# Patient Record
Sex: Female | Born: 1965 | Race: Black or African American | Hispanic: No | Marital: Single | Smoking: Never smoker
Health system: Southern US, Community
[De-identification: ages and names within clinical notes are randomized; demographics above are authoritative.]

## PROBLEM LIST (undated history)

## (undated) DIAGNOSIS — I10 Essential (primary) hypertension: Secondary | ICD-10-CM

## (undated) DIAGNOSIS — E119 Type 2 diabetes mellitus without complications: Secondary | ICD-10-CM

## (undated) DIAGNOSIS — E785 Hyperlipidemia, unspecified: Secondary | ICD-10-CM

## (undated) HISTORY — DX: Hyperlipidemia, unspecified: E78.5

## (undated) HISTORY — PX: TUBAL LIGATION: SHX77

---

## 1999-01-28 ENCOUNTER — Other Ambulatory Visit: Admission: RE | Admit: 1999-01-28 | Discharge: 1999-01-28 | Payer: Self-pay | Admitting: Obstetrics and Gynecology

## 1999-05-13 ENCOUNTER — Emergency Department (HOSPITAL_COMMUNITY): Admission: EM | Admit: 1999-05-13 | Discharge: 1999-05-13 | Payer: Self-pay | Admitting: Emergency Medicine

## 1999-08-06 ENCOUNTER — Inpatient Hospital Stay (HOSPITAL_COMMUNITY): Admission: AD | Admit: 1999-08-06 | Discharge: 1999-08-10 | Payer: Self-pay | Admitting: Obstetrics and Gynecology

## 1999-08-06 ENCOUNTER — Encounter (INDEPENDENT_AMBULATORY_CARE_PROVIDER_SITE_OTHER): Payer: Self-pay | Admitting: Specialist

## 1999-09-08 ENCOUNTER — Other Ambulatory Visit: Admission: RE | Admit: 1999-09-08 | Discharge: 1999-09-08 | Payer: Self-pay | Admitting: Obstetrics and Gynecology

## 2000-01-10 ENCOUNTER — Ambulatory Visit (HOSPITAL_COMMUNITY): Admission: RE | Admit: 2000-01-10 | Discharge: 2000-01-10 | Payer: Self-pay | Admitting: *Deleted

## 2001-04-02 ENCOUNTER — Other Ambulatory Visit: Admission: RE | Admit: 2001-04-02 | Discharge: 2001-04-02 | Payer: Self-pay | Admitting: Internal Medicine

## 2003-02-04 ENCOUNTER — Encounter: Admission: RE | Admit: 2003-02-04 | Discharge: 2003-02-04 | Payer: Self-pay | Admitting: Family Medicine

## 2003-08-05 ENCOUNTER — Emergency Department (HOSPITAL_COMMUNITY): Admission: AD | Admit: 2003-08-05 | Discharge: 2003-08-05 | Payer: Self-pay | Admitting: Family Medicine

## 2004-11-17 ENCOUNTER — Other Ambulatory Visit: Admission: RE | Admit: 2004-11-17 | Discharge: 2004-11-17 | Payer: Self-pay | Admitting: Family Medicine

## 2005-04-07 ENCOUNTER — Emergency Department (HOSPITAL_COMMUNITY): Admission: EM | Admit: 2005-04-07 | Discharge: 2005-04-07 | Payer: Self-pay | Admitting: Family Medicine

## 2006-01-04 ENCOUNTER — Other Ambulatory Visit: Admission: RE | Admit: 2006-01-04 | Discharge: 2006-01-04 | Payer: Self-pay | Admitting: Family Medicine

## 2007-01-04 ENCOUNTER — Encounter: Admission: RE | Admit: 2007-01-04 | Discharge: 2007-01-04 | Payer: Self-pay | Admitting: Physician Assistant

## 2007-01-24 ENCOUNTER — Other Ambulatory Visit: Admission: RE | Admit: 2007-01-24 | Discharge: 2007-01-24 | Payer: Self-pay | Admitting: Family Medicine

## 2008-02-01 ENCOUNTER — Other Ambulatory Visit: Admission: RE | Admit: 2008-02-01 | Discharge: 2008-02-01 | Payer: Self-pay | Admitting: Family Medicine

## 2008-02-01 ENCOUNTER — Encounter: Admission: RE | Admit: 2008-02-01 | Discharge: 2008-02-01 | Payer: Self-pay | Admitting: Family Medicine

## 2009-03-03 ENCOUNTER — Encounter: Admission: RE | Admit: 2009-03-03 | Discharge: 2009-03-03 | Payer: Self-pay | Admitting: Family Medicine

## 2009-03-17 ENCOUNTER — Other Ambulatory Visit: Admission: RE | Admit: 2009-03-17 | Discharge: 2009-03-17 | Payer: Self-pay | Admitting: Family Medicine

## 2010-03-04 ENCOUNTER — Encounter: Admission: RE | Admit: 2010-03-04 | Discharge: 2010-03-04 | Payer: Self-pay | Admitting: Family Medicine

## 2010-03-22 ENCOUNTER — Other Ambulatory Visit: Admission: RE | Admit: 2010-03-22 | Discharge: 2010-03-22 | Payer: Self-pay | Admitting: Family Medicine

## 2010-12-10 NOTE — Op Note (Signed)
[facility]  Patient:    April Daniel, April Daniel                     MRN: [id number] Proc. Date: 01/10/00 Adm. Date:  [id number] Disc. Date: [id number] Attending:  Pleas Koch                           Operative Report  PREOPERATIVE DIAGNOSIS:       Elective sterilization, desired.  POSTOPERATIVE DIAGNOSIS:      Elective sterilization, desired.  OPERATION:                    Laparoscopic tubal cautery.  SURGEON:                      Georgina Peer, M.D.  ASSISTANT:  ANESTHESIA:                   General anesthesia.  ESTIMATED BLOOD LOSS:         Less than 25 cc.  COMPLICATIONS:                None.  FINDINGS:                     Normal tubes and ovaries.  INDICATIONS:                  A 45 year old, gravida 3, para 2, desiring permanent sterilization.  She understands risks and complications of laparoscopy including bowel, bladder, and vascular injury, bleeding, infection, and lifetime failure ate of 1 to 2%.  She accepted these and wished to proceed.  DESCRIPTION OF PROCEDURE:     She was taken to the operating room and given a general anesthesia and placed in the dorsal lithotomy position.  The abdomen, perineum, and vagina were prepped and draped sterilely.  The bladder was emptied with a catheter.  Bimanual examination revealed anterior normal size uterus with no adnexal masses.  A vertical subumbilical incision was made and a Veress needle placed under low pressures insufflating 2.5 liters of carbon dioxide gas.  The laparoscopic trocar and sleeve were placed and under direct vision, the tubes were identified, traced to their fimbriated ends, cauterized with bipolar cautery in the midportion until bubbling, blanching, and no current passed through the tubes. A 2 cm segment on each side were cauterized.  Both ovaries looked normal.  There was a slight retraction pocket just lateral to the ureter on the right, but  no evidence of gross endometriosis or adhesions.  The upper abdomen was without adhesions and the uterus appeared normal.  Photodocumentation of these findings was made. The laparoscope was removed after 5 cc of 0.25% Marcaine was drizzled on the cauterized tubes.  The air was allowed to escape.  5 cc of 0.25% Marcaine was injected into the skin incision and then the skin incision was closed with subcuticular Dexon. The vaginal instrument was removed.  Sponge, needle, and instrument counts were  correct.  The patient returned to the recovery room in good condition. DD:  01/10/00 TD:  01/12/00 Job: [id number]

## 2010-12-10 NOTE — Discharge Summary (Signed)
[facility]  Patient:    April Daniel                      MRN: [id number] Adm. Date:  [id number] Disc. Date: [id number] Attending:  Ardeen Fillers                           Discharge Summary  DISCHARGE DIAGNOSES:          1. Intrauterine pregnancy at 37+ weeks gestational                                  age.                               2. Rh positive.                               3. Pregnancy-induced hypertension.                               4. Obesity.                               5. Postpartum hemorrhage secondary to uterine atony.                               6. Postpartum anemia.  PROCEDURE:                    Spontaneous vaginal delivery, repair of midline episiotomy on August 07, 1999.  Pitocin induction on January 12 to August 07, 1999.  HISTORY OF PRESENT ILLNESS:   A 45 year old woman, gravida 3, para 0-0-2-0, estimated date of confinement August 26, 1999, on the basis of first trimester  ultrasound admitted for worsening hypertension.  The patient had a slight headache. There have been no reports of intrauterine growth restriction or oligohydramnios. Blood pressure on examination in the office on January 12, was 170/108.  The patient has maintained complete bed rest and has received Aldamet during her pregnancy.  Labetolol was added recently.  Recent proteinuria evaluation was 665 mg for 24 hours.  HOSPITAL COURSE:              The patient was admitted to [facility].  She was treated with magnesium IV.  Cervix on admission was 3 cm.  Pitocin was initiated.  Amniotomy was performed for clear fluid.  Aldamet and labetolol were continued intrapartum.  The patient received IV analgesia in the  early part of labor and then received her epidural at her request.  Labor progressed steadily with a second stage of one hour and one minute.  She was delivered spontaneously of a live female, 2950 grams  with Apgars of 8 and 9 over a midline episiotomy.  Nuchal cord was reduced at the time of delivery.  Uterine atony occurred with an estimated blood loss of approximately 800 cc which responded to bimanual massage of the uterus and IV Pitocin.  Episiotomy was repaired without difficulty.  Arterial cord pH was 7.23.  Placenta was sent to pathology and later revealed  a third trimester placenta with three vessel umbilical cord.  Magnesium was continued for the first 24 hours.  Blood pressure stabilized nicely with diastolics in the 50 to 60 range.  Hemoglobin stabilized at 9.7.  This was  well tolerated and treated conservatively.  On the second postpartum day, diastolic blood pressures were intermittently elevated. She received IV labetolol and blood pressures stabilized with diastolics in the 80 to 90 range thereafter.  She is discharged to home on the third postpartum day in stable condition.  DISCHARGE MEDICATIONS:        1. Labetolol 200 mg p.o. b.i.d.                               2. Prenatal vitamins and iron.                               3. Nonsteroidal anti-inflammatory agents.  FOLLOW-UP:                    She will return to the office in one week for repeat blood pressure check.  She will be followed up in the office for routine postpartum care in four to six weeks.DD:  09/15/99 TD:  09/15/99 Job: [id number]

## 2011-01-27 ENCOUNTER — Other Ambulatory Visit: Payer: Self-pay | Admitting: Family Medicine

## 2011-01-27 DIAGNOSIS — Z1231 Encounter for screening mammogram for malignant neoplasm of breast: Secondary | ICD-10-CM

## 2011-04-01 ENCOUNTER — Other Ambulatory Visit: Payer: Self-pay | Admitting: Physician Assistant

## 2011-04-01 ENCOUNTER — Other Ambulatory Visit (HOSPITAL_COMMUNITY)
Admission: RE | Admit: 2011-04-01 | Discharge: 2011-04-01 | Disposition: A | Discharge status: Home / Self Care | Payer: PRIVATE HEALTH INSURANCE | Source: Ambulatory Visit | Attending: Family Medicine | Admitting: Family Medicine

## 2011-04-01 ENCOUNTER — Ambulatory Visit
Admission: RE | Admit: 2011-04-01 | Discharge: 2011-04-01 | Disposition: A | Discharge status: Home / Self Care | Payer: PRIVATE HEALTH INSURANCE | Source: Ambulatory Visit | Attending: Family Medicine | Admitting: Family Medicine

## 2011-04-01 DIAGNOSIS — Z01419 Encounter for gynecological examination (general) (routine) without abnormal findings: Secondary | ICD-10-CM | POA: Insufficient documentation

## 2011-04-01 DIAGNOSIS — Z1231 Encounter for screening mammogram for malignant neoplasm of breast: Secondary | ICD-10-CM

## 2012-02-06 ENCOUNTER — Other Ambulatory Visit: Payer: Self-pay | Admitting: Family Medicine

## 2012-02-06 DIAGNOSIS — Z1231 Encounter for screening mammogram for malignant neoplasm of breast: Secondary | ICD-10-CM

## 2012-04-02 ENCOUNTER — Other Ambulatory Visit: Payer: Self-pay | Admitting: Physician Assistant

## 2012-04-02 ENCOUNTER — Ambulatory Visit
Admission: RE | Admit: 2012-04-02 | Discharge: 2012-04-02 | Disposition: A | Discharge status: Home / Self Care | Payer: BC Managed Care – PPO | Source: Ambulatory Visit | Attending: Family Medicine | Admitting: Family Medicine

## 2012-04-02 ENCOUNTER — Other Ambulatory Visit (HOSPITAL_COMMUNITY)
Admission: RE | Admit: 2012-04-02 | Discharge: 2012-04-02 | Disposition: A | Discharge status: Home / Self Care | Payer: BC Managed Care – PPO | Source: Ambulatory Visit | Attending: Family Medicine | Admitting: Family Medicine

## 2012-04-02 DIAGNOSIS — Z1231 Encounter for screening mammogram for malignant neoplasm of breast: Secondary | ICD-10-CM

## 2012-04-02 DIAGNOSIS — Z124 Encounter for screening for malignant neoplasm of cervix: Secondary | ICD-10-CM | POA: Insufficient documentation

## 2013-03-04 ENCOUNTER — Other Ambulatory Visit: Payer: Self-pay

## 2013-03-04 DIAGNOSIS — Z1231 Encounter for screening mammogram for malignant neoplasm of breast: Secondary | ICD-10-CM

## 2013-03-08 ENCOUNTER — Other Ambulatory Visit: Payer: Self-pay | Admitting: Physician Assistant

## 2013-03-08 DIAGNOSIS — N6452 Nipple discharge: Secondary | ICD-10-CM

## 2013-04-03 ENCOUNTER — Ambulatory Visit
Admission: RE | Admit: 2013-04-03 | Discharge: 2013-04-03 | Disposition: A | Discharge status: Home / Self Care | Payer: PRIVATE HEALTH INSURANCE | Source: Ambulatory Visit | Attending: Physician Assistant | Admitting: Physician Assistant

## 2013-04-03 DIAGNOSIS — N6452 Nipple discharge: Secondary | ICD-10-CM

## 2013-04-26 ENCOUNTER — Other Ambulatory Visit: Payer: Self-pay | Admitting: Physician Assistant

## 2013-04-26 ENCOUNTER — Other Ambulatory Visit (HOSPITAL_COMMUNITY)
Admission: RE | Admit: 2013-04-26 | Discharge: 2013-04-26 | Disposition: A | Discharge status: Home / Self Care | Payer: PRIVATE HEALTH INSURANCE | Source: Ambulatory Visit | Attending: Family Medicine | Admitting: Family Medicine

## 2013-04-26 DIAGNOSIS — Z124 Encounter for screening for malignant neoplasm of cervix: Secondary | ICD-10-CM | POA: Insufficient documentation

## 2014-03-05 ENCOUNTER — Other Ambulatory Visit: Payer: Self-pay

## 2014-03-05 DIAGNOSIS — Z1231 Encounter for screening mammogram for malignant neoplasm of breast: Secondary | ICD-10-CM

## 2014-04-07 ENCOUNTER — Ambulatory Visit
Admission: RE | Admit: 2014-04-07 | Discharge: 2014-04-07 | Disposition: A | Discharge status: Home / Self Care | Payer: 59 | Source: Ambulatory Visit

## 2014-04-07 DIAGNOSIS — Z1231 Encounter for screening mammogram for malignant neoplasm of breast: Secondary | ICD-10-CM

## 2014-05-16 ENCOUNTER — Other Ambulatory Visit (HOSPITAL_COMMUNITY)
Admission: RE | Admit: 2014-05-16 | Discharge: 2014-05-16 | Disposition: A | Discharge status: Home / Self Care | Payer: 59 | Source: Ambulatory Visit | Attending: Family Medicine | Admitting: Family Medicine

## 2014-05-16 ENCOUNTER — Other Ambulatory Visit: Payer: Self-pay | Admitting: Physician Assistant

## 2014-05-16 DIAGNOSIS — Z124 Encounter for screening for malignant neoplasm of cervix: Secondary | ICD-10-CM | POA: Insufficient documentation

## 2014-05-19 LAB — CYTOLOGY - PAP

## 2015-05-11 ENCOUNTER — Other Ambulatory Visit: Payer: Self-pay

## 2015-05-11 DIAGNOSIS — Z1231 Encounter for screening mammogram for malignant neoplasm of breast: Secondary | ICD-10-CM

## 2015-05-27 ENCOUNTER — Ambulatory Visit
Admission: RE | Admit: 2015-05-27 | Discharge: 2015-05-27 | Disposition: A | Discharge status: Home / Self Care | Payer: 59 | Source: Ambulatory Visit

## 2015-05-27 DIAGNOSIS — Z1231 Encounter for screening mammogram for malignant neoplasm of breast: Secondary | ICD-10-CM

## 2015-05-29 ENCOUNTER — Other Ambulatory Visit (HOSPITAL_COMMUNITY)
Admission: RE | Admit: 2015-05-29 | Discharge: 2015-05-29 | Disposition: A | Discharge status: Home / Self Care | Payer: 59 | Source: Ambulatory Visit | Attending: Family Medicine | Admitting: Family Medicine

## 2015-05-29 ENCOUNTER — Other Ambulatory Visit: Payer: Self-pay | Admitting: Physician Assistant

## 2015-05-29 DIAGNOSIS — Z124 Encounter for screening for malignant neoplasm of cervix: Secondary | ICD-10-CM | POA: Diagnosis not present

## 2015-06-01 LAB — CYTOLOGY - PAP

## 2016-05-03 ENCOUNTER — Other Ambulatory Visit: Payer: Self-pay | Admitting: Physician Assistant

## 2016-05-03 DIAGNOSIS — Z1231 Encounter for screening mammogram for malignant neoplasm of breast: Secondary | ICD-10-CM

## 2016-05-30 ENCOUNTER — Ambulatory Visit
Admission: RE | Admit: 2016-05-30 | Discharge: 2016-05-30 | Disposition: A | Discharge status: Home / Self Care | Payer: 59 | Source: Ambulatory Visit | Attending: Physician Assistant | Admitting: Physician Assistant

## 2016-05-30 DIAGNOSIS — Z1231 Encounter for screening mammogram for malignant neoplasm of breast: Secondary | ICD-10-CM

## 2017-05-03 ENCOUNTER — Other Ambulatory Visit: Payer: Self-pay | Admitting: Physician Assistant

## 2017-05-03 DIAGNOSIS — Z1231 Encounter for screening mammogram for malignant neoplasm of breast: Secondary | ICD-10-CM

## 2017-05-19 ENCOUNTER — Emergency Department (HOSPITAL_COMMUNITY): Payer: Worker's Compensation

## 2017-05-19 ENCOUNTER — Encounter (HOSPITAL_COMMUNITY): Payer: Self-pay | Admitting: *Deleted

## 2017-05-19 ENCOUNTER — Emergency Department (HOSPITAL_COMMUNITY)
Admission: EM | Admit: 2017-05-19 | Discharge: 2017-05-19 | Disposition: A | Discharge status: Home / Self Care | Payer: Worker's Compensation | Attending: Emergency Medicine | Admitting: Emergency Medicine

## 2017-05-19 DIAGNOSIS — I10 Essential (primary) hypertension: Secondary | ICD-10-CM | POA: Diagnosis not present

## 2017-05-19 DIAGNOSIS — Y93D1 Activity, knitting and crocheting: Secondary | ICD-10-CM | POA: Diagnosis not present

## 2017-05-19 DIAGNOSIS — S6992XA Unspecified injury of left wrist, hand and finger(s), initial encounter: Secondary | ICD-10-CM | POA: Diagnosis present

## 2017-05-19 DIAGNOSIS — S60451A Superficial foreign body of left index finger, initial encounter: Secondary | ICD-10-CM | POA: Diagnosis not present

## 2017-05-19 DIAGNOSIS — Y999 Unspecified external cause status: Secondary | ICD-10-CM | POA: Insufficient documentation

## 2017-05-19 DIAGNOSIS — W458XXA Other foreign body or object entering through skin, initial encounter: Secondary | ICD-10-CM | POA: Diagnosis not present

## 2017-05-19 DIAGNOSIS — Z23 Encounter for immunization: Secondary | ICD-10-CM | POA: Diagnosis not present

## 2017-05-19 DIAGNOSIS — S61221A Laceration with foreign body of left index finger without damage to nail, initial encounter: Secondary | ICD-10-CM | POA: Diagnosis not present

## 2017-05-19 DIAGNOSIS — Y929 Unspecified place or not applicable: Secondary | ICD-10-CM | POA: Insufficient documentation

## 2017-05-19 HISTORY — DX: Essential (primary) hypertension: I10

## 2017-05-19 MED ORDER — TETANUS-DIPHTH-ACELL PERTUSSIS 5-2.5-18.5 LF-MCG/0.5 IM SUSP
0.5000 mL | Freq: Once | INTRAMUSCULAR | Status: AC
  Administered 2017-05-19: 0.5 mL via INTRAMUSCULAR
  Filled 2017-05-19: qty 0.5

## 2017-05-19 NOTE — Discharge Instructions (Signed)
You may remove the bandage after 24 hours. Clean the wound and surrounding area gently with tap water and mild soap. Rinse well and blot dry. Do not scrub the wound, as this may cause the wound edges to come apart. You may shower, but avoid submerging the wound, such as with a bath or swimming. Clean the wound daily to prevent infection. Do not use cleaners such as hydrogen peroxide or alcohol.   Scar reduction: Application of a topical antibiotic ointment, such as Neosporin, after the wound has begun to close and heal well can decrease scab formation and reduce scarring. After the wound has healed and wound closures have been removed, application of ointments such as Aquaphor can also reduce scar formation.  The key to scar reduction is keeping the skin well hydrated and supple. Drinking plenty of water throughout the day (At least eight 8oz glasses of water a day) is essential to staying well hydrated.  Sun exposure: Keep the wound out of the sun. After the wound has healed, continue to protect it from the sun by wearing protective clothing or applying sunscreen.  Pain: You may use Tylenol, naproxen, or ibuprofen for pain.  Antiinflammatory medications: Take 600 mg of ibuprofen every 6 hours or 440 mg (over the counter dose) to 500 mg (prescription dose) of naproxen every 12 hours for the next 3 days. After this time, these medications may be used as needed for pain. Take these medications with food to avoid upset stomach. Choose only one of these medications, do not take them together. Tylenol: Should you continue to have additional pain while taking the ibuprofen or naproxen, you may add in tylenol as needed. Your daily total maximum amount of tylenol from all sources should be limited to 4000mg /day for persons without liver problems, or 2000mg /day for those with liver problems.  Suture/staple removal: Return to the ED in 8-10 days for suture removal.  Return to the ED sooner should the wound  edges come apart or signs of infection arise, such as spreading redness, puffiness/swelling, pus draining from the wound, severe increase in pain, fever over 100.74F, or any other major issues.

## 2017-05-19 NOTE — ED Provider Notes (Signed)
[facility]-EMERGENCY DEPT Provider Note   CSN: [id number] Arrival date & time: 05/19/17  0955     History   Chief Complaint Chief Complaint  Patient presents with  . knitting hook stuck in finger    HPI April Daniel is a 51 y.o. female.  HPI   April Daniel is a 51 y.o. female, with a history of HTN, presenting to the ED with a foreign body in the left index finger.  Patient was knitting, slipped, and the needle became embedded in the skin of her finger.  Pain is sharp, 2/10, nonradiating.  Has not tried any therapies prior to arrival.  Tetanus not up-to-date.  Patient denies numbness, tingling, weakness, or other complaints.     Past Medical History:  Diagnosis Date  . Hypertension     There are no active problems to display for this patient.   History reviewed. No pertinent surgical history.  OB History    No data available       Home Medications    Prior to Admission medications   Not on File    Family History No family history on file.  Social History Social History  Substance Use Topics  . Smoking status: Never Smoker  . Smokeless tobacco: Never Used  . Alcohol use No     Allergies   Patient has no known allergies.   Review of Systems Review of Systems  Skin: Positive for wound.       Foreign body  Neurological: Negative for weakness and numbness.     Physical Exam Updated Vital Signs BP (!) 138/105   Pulse 71   Temp 97.8 F (36.6 C)   Resp 18   SpO2 97%   Physical Exam  Constitutional: She appears well-developed and well-nourished. No distress.  HENT:  Head: Normocephalic and atraumatic.  Eyes: Conjunctivae are normal.  Neck: Neck supple.  Cardiovascular: Normal rate, regular rhythm and intact distal pulses.   Pulmonary/Chest: Effort normal.  Musculoskeletal:  Full range of motion at the MCP, PIP, and DIP joints of the left index finger.  Neurological: She is alert.  No sensory deficits  noted to the left index finger.  Skin: Skin is warm and dry. Capillary refill takes less than 2 seconds. She is not diaphoretic. No pallor.  Very small diameter needlelike object embedded in the skin of the dorsal-radial left index finger between the MCP and the PIP joints.  Psychiatric: She has a normal mood and affect. Her behavior is normal.  Nursing note and vitals reviewed.           ED Treatments / Results  Labs (all labs ordered are listed, but only abnormal results are displayed) Labs Reviewed - No data to display  EKG  EKG Interpretation None       Radiology Dg Finger Index Left  Result Date: 05/19/2017 CLINICAL DATA:  Left index finger foreign body removal. EXAM: LEFT INDEX FINGER 2+V COMPARISON:  Earlier the same date. FINDINGS: Examination at 1439 hours demonstrates interval removal of the metallic knitting needle from the index finger. No retained foreign body is demonstrated. No evidence of acute fracture, dislocation or bone destruction. Mild soft tissue swelling. IMPRESSION: Successful foreign body removal.  No acute osseous findings. Electronically Signed   By: Carey Bullocks M.D.   On: 05/19/2017 15:00   Dg Finger Index Left  Result Date: 05/19/2017 CLINICAL DATA:  Knitting needle in the left index finger. Initial encounter. EXAM: LEFT INDEX FINGER 2+V COMPARISON:  None. FINDINGS: A knitting needle is seen in the lateral soft tissues of the left index finger. The tip of the medial is at the level the mid diaphysis of the proximal phalanx and is in the shallow subcutaneous tissues. No other abnormality is identified. IMPRESSION: Knitting needle in the left index finger as described above. Electronically Signed   By: Drusilla Kannerhomas  Dalessio M.D.   On: 05/19/2017 11:03    Procedures .Nerve Block Date/Time: 05/19/2017 11:20 AM Performed by: Anselm PancoastJOY, Leeon Makar C Authorized by: Anselm PancoastJOY, Lorna Strother C   Consent:    Consent obtained:  Verbal   Consent given by:  Patient   Risks  discussed:  Swelling, unsuccessful block and pain Indications:    Indications:  Procedural anesthesia and pain relief Location:    Body area:  Upper extremity   Upper extremity nerve blocked: Digital.   Laterality:  Left Pre-procedure details:    Skin preparation:  Alcohol Procedure details (see MAR for exact dosages):    Block needle gauge:  25 G   Anesthetic injected:  Bupivacaine 0.5% w/o epi   Injection procedure:  Anatomic landmarks identified, anatomic landmarks palpated, incremental injection, introduced needle and negative aspiration for blood Post-procedure details:    Outcome:  Anesthesia achieved   Patient tolerance of procedure:  Tolerated well, no immediate complications .Foreign Body Removal Date/Time: 05/19/2017 11:33 AM Performed by: Anselm PancoastJOY, Cyan Clippinger C Authorized by: Anselm PancoastJOY, Jaleyah Longhi C  Consent: Verbal consent obtained. Risks and benefits: risks, benefits and alternatives were discussed Consent given by: patient Patient understanding: patient states understanding of the procedure being performed Patient consent: the patient's understanding of the procedure matches consent given Procedure consent: procedure consent matches procedure scheduled Patient identity confirmed: verbally with patient and provided demographic data Body area: skin General location: upper extremity Location details: left index finger Anesthesia: digital block  Anesthesia: Local Anesthetic: bupivacaine 0.5% without epinephrine  Sedation: Patient sedated: no Patient restrained: no Patient cooperative: yes Localization method: visualized and serial x-rays Removal mechanism: scalpel and hemostat Depth: subcutaneous Complexity: complex 1 objects recovered. Objects recovered: Knitting needle Post-procedure assessment: foreign body removed Patient tolerance: Patient tolerated the procedure well with no immediate complications Comments: 1 cm incision created to facilitate foreign body  removal. ..Laceration Repair Date/Time: 05/19/2017 11:33 AM Performed by: Anselm PancoastJOY, Joline Encalada C Authorized by: Anselm PancoastJOY, Priscille Shadduck C   Consent:    Consent obtained:  Verbal   Consent given by:  Patient   Risks discussed:  Infection, pain, poor cosmetic result, poor wound healing and need for additional repair Anesthesia (see MAR for exact dosages):    Anesthesia method:  Nerve block   Block location:  Digital   Block needle gauge:  25 G   Block anesthetic:  Bupivacaine 0.5% w/o epi   Block injection procedure:  Anatomic landmarks identified, anatomic landmarks palpated, introduced needle, negative aspiration for blood and incremental injection   Block outcome:  Incomplete block Laceration details:    Location:  Finger   Finger location:  L index finger   Length (cm):  1 Repair type:    Repair type:  Simple Pre-procedure details:    Preparation:  Patient was prepped and draped in usual sterile fashion and imaging obtained to evaluate for foreign bodies Exploration:    Wound exploration: wound explored through full range of motion and entire depth of wound probed and visualized   Treatment:    Area cleansed with:  Saline   Amount of cleaning:  Extensive   Irrigation solution:  Sterile saline  Irrigation method:  Syringe   Visualized foreign bodies/material removed: yes   Skin repair:    Repair method:  Sutures   Suture size:  5-0   Suture material:  Prolene   Suture technique:  Simple interrupted   Number of sutures:  1 Approximation:    Approximation:  Close Post-procedure details:    Dressing:  Non-adherent dressing   Patient tolerance of procedure:  Tolerated well, no immediate complications   (including critical care time)  Medications Ordered in ED Medications  Tdap (BOOSTRIX) injection 0.5 mL (0.5 mLs Intramuscular Given 05/19/17 1225)     Initial Impression / Assessment and Plan / ED Course  I have reviewed the triage vital signs and the nursing notes.  Pertinent labs &  imaging results that were available during my care of the patient were reviewed by me and considered in my medical decision making (see chart for details).     Patient presents with a foreign body to the left index finger.  Review of x-rays and correlation with exam given the impression of a rather superficial orientation.  Incision created to facilitate removal due to a small, thin, fragile hook on the end of the object.  Object removed successfully.  No evidence of remaining foreign bodies on repeat x-ray.  Patient to return in 8-10 days for suture removal. The patient was given instructions for home care as well as return precautions. Patient voices understanding of these instructions, accepts the plan, and is comfortable with discharge.     Final Clinical Impressions(s) / ED Diagnoses   Final diagnoses:  Foreign body of left index finger  Laceration of left index finger with foreign body without damage to nail, initial encounter    New Prescriptions There are no discharge medications for this patient.    Anselm Pancoast, PA-C 05/22/17 1532    Tilden Fossa, MD 05/24/17 [id number]

## 2017-05-19 NOTE — ED Notes (Addendum)
Pt is alert and oriented x 4 and is verbally. Pt denies pain at this time. Pt is cooperative and friendly. Pt has needle hook in place at this time to left index finger right lateral side.  Pt states area has been medically numbed at this time.

## 2017-05-19 NOTE — ED Notes (Signed)
Bed: WTR9 Expected date:  Expected time:  Means of arrival:  Comments: 

## 2017-05-19 NOTE — ED Notes (Signed)
Pt awaiting medical instrument from OR to remove foreign object.

## 2017-05-19 NOTE — ED Triage Notes (Signed)
Pt has a knitting hook stuck in her left index finger. Pt was knitting at work when she accidentally stuck the hook in her finger. Pain is 2/10 at this time.

## 2017-05-30 ENCOUNTER — Emergency Department (HOSPITAL_COMMUNITY)
Admission: EM | Admit: 2017-05-30 | Discharge: 2017-05-30 | Disposition: A | Discharge status: Home / Self Care | Payer: Worker's Compensation | Attending: Physician Assistant | Admitting: Physician Assistant

## 2017-05-30 DIAGNOSIS — Z4802 Encounter for removal of sutures: Secondary | ICD-10-CM | POA: Insufficient documentation

## 2017-05-30 DIAGNOSIS — S61211D Laceration without foreign body of left index finger without damage to nail, subsequent encounter: Secondary | ICD-10-CM | POA: Insufficient documentation

## 2017-05-30 DIAGNOSIS — W273XXA Contact with needle (sewing), initial encounter: Secondary | ICD-10-CM | POA: Diagnosis not present

## 2017-05-30 DIAGNOSIS — I10 Essential (primary) hypertension: Secondary | ICD-10-CM | POA: Insufficient documentation

## 2017-05-30 NOTE — Discharge Instructions (Signed)
Apply antibiotic ointment if needed.  Return to the ED for any concerning signs or symptoms develop.

## 2017-05-30 NOTE — ED Triage Notes (Signed)
One suture noted to left hand-index finger. No signs of infection reported at site. Suture was placed last Friday and patient here for suture removal.

## 2017-05-30 NOTE — ED Provider Notes (Signed)
[facility]-EMERGENCY DEPT Provider Note   CSN: [id number] Arrival date & time: 05/30/17  1628     History   Chief Complaint Chief Complaint  Patient presents with  . Suture / Staple Removal    HPI April LeatherwoodSamantha Zervas is a 51 y.o. female presents today requesting suture removal.  10 days ago 1 suture was placedTo the left index finger secondary to injury with a knitting needle.  She denies fevers, chills, drainage, redness, pain, numbness, tingling, or weakness.   The history is provided by the patient.    Past Medical History:  Diagnosis Date  . Hypertension     There are no active problems to display for this patient.   No past surgical history on file.  OB History    No data available       Home Medications    Prior to Admission medications   Not on File    Family History No family history on file.  Social History Social History   Tobacco Use  . Smoking status: Never Smoker  . Smokeless tobacco: Never Used  Substance Use Topics  . Alcohol use: No  . Drug use: No     Allergies   Patient has no known allergies.   Review of Systems Review of Systems  Constitutional: Negative for chills and fever.  Skin: Positive for wound.  Neurological: Negative for weakness and numbness.     Physical Exam Updated Vital Signs BP (!) 136/95 (BP Location: Left Arm)   Pulse 78   Temp 97.9 F (36.6 C) (Oral)   Resp 18   Ht 5\' 10"  (1.778 m)   Wt 108.9 kg (240 lb)   SpO2 98%   BMI 34.44 kg/m   Physical Exam  Constitutional: She appears well-developed and well-nourished. No distress.  HENT:  Head: Normocephalic and atraumatic.  Eyes: Conjunctivae are normal. Right eye exhibits no discharge. Left eye exhibits no discharge.  Neck: No JVD present. No tracheal deviation present.  Cardiovascular: Normal rate and intact distal pulses.  2+ radial pulses bilaterally  Pulmonary/Chest: Effort normal.  Abdominal: She exhibits no distension.    Musculoskeletal: Normal range of motion. She exhibits no edema or tenderness.  Neurological: She is alert. No sensory deficit. She exhibits normal muscle tone.  Skin: Skin is warm and dry. Capillary refill takes less than 2 seconds. No erythema.  1 simple interrupted suture in place to the radial aspect of the left second digit.  No erythema, fluctuance, tenderness, or drainage noted.  Psychiatric: She has a normal mood and affect. Her behavior is normal.  Nursing note and vitals reviewed.    ED Treatments / Results  Labs (all labs ordered are listed, but only abnormal results are displayed) Labs Reviewed - No data to display  EKG  EKG Interpretation None       Radiology No results found.  Procedures .Suture Removal Date/Time: 05/30/2017 4:49 PM Performed by: Jeanie SewerFawze, Timberlynn Kizziah A, PA-C Authorized by: Jeanie SewerFawze, Trinna Kunst A, PA-C   Consent:    Consent obtained:  Verbal   Consent given by:  Patient   Risks discussed:  Bleeding, pain and wound separation Location:    Location:  Upper extremity   Upper extremity location:  Hand   Hand location:  L index finger Procedure details:    Wound appearance:  No signs of infection, good wound healing, clean, nontender and nonpurulent   Number of sutures removed:  1 Post-procedure details:    Post-removal:  No dressing applied  Patient tolerance of procedure:  Tolerated well, no immediate complications   (including critical care time)  Medications Ordered in ED Medications - No data to display   Initial Impression / Assessment and Plan / ED Course  I have reviewed the triage vital signs and the nursing notes.  Pertinent labs & imaging results that were available during my care of the patient were reviewed by me and considered in my medical decision making (see chart for details).     Pt to ER for suture removal and wound check as above. Procedure tolerated well. Vitals normal, no signs of infection. Scar minimization & return  precautions given at dc. Pt verbalized understanding of and agreement with plan and is safe for discharge home at this time.  She has no complaints prior to discharge.   Final Clinical Impressions(s) / ED Diagnoses   Final diagnoses:  Visit for suture removal    ED Discharge Orders    None       Jeanie SewerFawze, Tamura Lasky A, PA-C 05/30/17 1650    Mackuen, Cindee Saltourteney Lyn, MD 05/30/17 2254

## 2017-06-01 ENCOUNTER — Ambulatory Visit
Admission: RE | Admit: 2017-06-01 | Discharge: 2017-06-01 | Disposition: A | Discharge status: Home / Self Care | Payer: 59 | Source: Ambulatory Visit | Attending: Physician Assistant | Admitting: Physician Assistant

## 2017-06-01 DIAGNOSIS — Z1231 Encounter for screening mammogram for malignant neoplasm of breast: Secondary | ICD-10-CM

## 2018-04-16 ENCOUNTER — Other Ambulatory Visit: Payer: Self-pay | Admitting: Occupational Medicine

## 2018-04-16 ENCOUNTER — Ambulatory Visit: Payer: Self-pay

## 2018-04-16 DIAGNOSIS — M25511 Pain in right shoulder: Secondary | ICD-10-CM

## 2018-04-19 ENCOUNTER — Encounter (HOSPITAL_BASED_OUTPATIENT_CLINIC_OR_DEPARTMENT_OTHER): Payer: Self-pay | Admitting: *Deleted

## 2018-04-19 ENCOUNTER — Other Ambulatory Visit: Payer: Self-pay | Admitting: Orthopedic Surgery

## 2018-04-19 ENCOUNTER — Other Ambulatory Visit: Payer: Self-pay

## 2018-04-19 NOTE — H&P (Signed)
April Daniel is an 52 y.o. female.   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  X-rays been obtained revealing a fracture of the distal end of the clavicle and she presents today in a sling.  She reports that the tramadol that she was prescribed is making her sick.  She has been out of work since the injury.  Past Medical History:  Diagnosis Date  . Hypertension     No past surgical history on file.  No family history on file. Social History:  reports that she has never smoked. She has never used smokeless tobacco. She reports that she does not drink alcohol or use drugs.  Allergies: No Known Allergies  No medications prior to admission.    No results found for this or any previous visit (from the past 48 hour(s)). No results found.  Review of Systems  All other systems reviewed and are negative.   There were no vitals taken for this visit. Physical Exam  Constitutional:  WD, WN, NAD HEENT:  NCAT, EOMI Neuro/Psych:  Alert & oriented to person, place, and time; appropriate mood & affect Lymphatic: No generalized UE edema or lymphadenopathy Extremities / MSK:  Both UE are normal with respect to appearance, ranges of motion, joint stability, muscle strength/tone, sensation, & perfusion except as otherwise noted:  The right shoulder is noted to be slightly swollen.  Right upper extremity is in a sling.  Intact light touch sensibility in the radial, median, ulnar, and axial nerve distributions with intact motor to the same.  Labs / Xrays:  2 views of the right shoulder ordered and obtained today in the sling reveal an oblique fracture through the distal clavicle, with what appears to be perhaps a nondisplaced inferior butterfly fragment.  The distal aspect of the clavicle as well  aligned at the [facility] joint and with respect to the coracoid, but the medial fragment remains displaced superiorly  Assessment: Displaced right distal clavicle fracture  Plan:  I discussed these findings with my partner, Dr. Ave Filter as well as with the patient.  I recommended open treatment to restore better alignment and promote union, thus promoting resumption of good function.  We will plan to proceed on Monday as an outpatient pending surgical authorization.  The details of the operative procedure were discussed with the patient.  Questions were invited and answered.  In addition to the goal of the procedure, the risks of the procedure to include but not limited to bleeding; infection; damage to the nerves or blood vessels that could result in bleeding, numbness, weakness, chronic pain, and the need for additional procedures; stiffness; the need for revision surgery; and anesthetic risks were reviewed.  No specific outcome was guaranteed or implied.  Informed consent was obtained.  Jodi Marble, MD 04/19/2018, 4:11 PM

## 2018-04-22 ENCOUNTER — Encounter (HOSPITAL_BASED_OUTPATIENT_CLINIC_OR_DEPARTMENT_OTHER): Payer: Self-pay | Admitting: Anesthesiology

## 2018-04-22 NOTE — Anesthesia Preprocedure Evaluation (Addendum)
Anesthesia Evaluation  Patient identified by MRN, date of birth, ID band Patient awake    Reviewed: Allergy & Precautions, NPO status , Patient's Chart, lab work & pertinent test results  Airway Mallampati: III  TM Distance: >3 FB Neck ROM: Full    Dental no notable dental hx. (+) Teeth Intact   Pulmonary neg pulmonary ROS,    Pulmonary exam normal breath sounds clear to auscultation       Cardiovascular hypertension, Pt. on medications Normal cardiovascular exam Rhythm:Regular Rate:Normal     Neuro/Psych negative neurological ROS  negative psych ROS   GI/Hepatic negative GI ROS, Neg liver ROS,   Endo/Other  Obesity Hyperlipidemia  Renal/GU negative Renal ROS  negative genitourinary   Musculoskeletal Distal right clavicle Fx   Abdominal (+) + obese,   Peds  Hematology negative hematology ROS (+)   Anesthesia Other Findings   Reproductive/Obstetrics                           Anesthesia Physical Anesthesia Plan  ASA: II  Anesthesia Plan: General   Post-op Pain Management:  Regional for Post-op pain   Induction: Intravenous  PONV Risk Score and Plan: 4 or greater and Scopolamine patch - Pre-op, Midazolam, Dexamethasone, Ondansetron and Treatment may vary due to age or medical condition  Airway Management Planned: Oral ETT  Additional Equipment:   Intra-op Plan:   Post-operative Plan: Extubation in OR  Informed Consent: I have reviewed the patients History and Physical, chart, labs and discussed the procedure including the risks, benefits and alternatives for the proposed anesthesia with the patient or authorized representative who has indicated his/her understanding and acceptance.   Dental advisory given  Plan Discussed with: CRNA and Surgeon  Anesthesia Plan Comments:        Anesthesia Quick Evaluation

## 2018-04-23 ENCOUNTER — Encounter (HOSPITAL_BASED_OUTPATIENT_CLINIC_OR_DEPARTMENT_OTHER): Payer: Self-pay | Admitting: Anesthesiology

## 2018-04-23 ENCOUNTER — Ambulatory Visit (HOSPITAL_COMMUNITY): Payer: Worker's Compensation

## 2018-04-23 ENCOUNTER — Ambulatory Visit (HOSPITAL_BASED_OUTPATIENT_CLINIC_OR_DEPARTMENT_OTHER): Payer: Worker's Compensation | Admitting: Anesthesiology

## 2018-04-23 ENCOUNTER — Encounter (HOSPITAL_BASED_OUTPATIENT_CLINIC_OR_DEPARTMENT_OTHER)
Admission: RE | Disposition: A | Discharge status: Home / Self Care | Payer: Self-pay | Source: Ambulatory Visit | Attending: Orthopedic Surgery

## 2018-04-23 ENCOUNTER — Ambulatory Visit (HOSPITAL_BASED_OUTPATIENT_CLINIC_OR_DEPARTMENT_OTHER)
Admission: RE | Admit: 2018-04-23 | Discharge: 2018-04-23 | Disposition: A | Discharge status: Home / Self Care | Payer: Worker's Compensation | Source: Ambulatory Visit | Attending: Orthopedic Surgery | Admitting: Orthopedic Surgery

## 2018-04-23 DIAGNOSIS — Z79899 Other long term (current) drug therapy: Secondary | ICD-10-CM | POA: Insufficient documentation

## 2018-04-23 DIAGNOSIS — E785 Hyperlipidemia, unspecified: Secondary | ICD-10-CM | POA: Insufficient documentation

## 2018-04-23 DIAGNOSIS — Z4789 Encounter for other orthopedic aftercare: Secondary | ICD-10-CM

## 2018-04-23 DIAGNOSIS — W11XXXA Fall on and from ladder, initial encounter: Secondary | ICD-10-CM | POA: Diagnosis not present

## 2018-04-23 DIAGNOSIS — I10 Essential (primary) hypertension: Secondary | ICD-10-CM | POA: Diagnosis not present

## 2018-04-23 DIAGNOSIS — Z6834 Body mass index (BMI) 34.0-34.9, adult: Secondary | ICD-10-CM | POA: Diagnosis not present

## 2018-04-23 DIAGNOSIS — S42031A Displaced fracture of lateral end of right clavicle, initial encounter for closed fracture: Secondary | ICD-10-CM | POA: Insufficient documentation

## 2018-04-23 DIAGNOSIS — E669 Obesity, unspecified: Secondary | ICD-10-CM | POA: Insufficient documentation

## 2018-04-23 HISTORY — PX: ORIF CLAVICULAR FRACTURE: SHX5055

## 2018-04-23 SURGERY — OPEN REDUCTION INTERNAL FIXATION (ORIF) CLAVICULAR FRACTURE
Anesthesia: General | Site: Shoulder | Laterality: Right

## 2018-04-23 MED ORDER — ONDANSETRON HCL 4 MG/2ML IJ SOLN
INTRAMUSCULAR | Status: AC
  Filled 2018-04-23: qty 2

## 2018-04-23 MED ORDER — MIDAZOLAM HCL 2 MG/2ML IJ SOLN
INTRAMUSCULAR | Status: AC
  Filled 2018-04-23: qty 2

## 2018-04-23 MED ORDER — FENTANYL CITRATE (PF) 100 MCG/2ML IJ SOLN
25.0000 ug | INTRAMUSCULAR | Status: DC | PRN
  Administered 2018-04-23 (×2): 50 ug via INTRAVENOUS

## 2018-04-23 MED ORDER — HYDROCODONE-ACETAMINOPHEN 7.5-325 MG PO TABS: 1.0000 | ORAL_TABLET | Freq: Once | ORAL | Status: DC | PRN

## 2018-04-23 MED ORDER — MELOXICAM 15 MG PO TABS: 15.0000 mg | ORAL_TABLET | Freq: Every day | ORAL | 2 refills | Status: AC

## 2018-04-23 MED ORDER — KETOROLAC TROMETHAMINE 30 MG/ML IJ SOLN
INTRAMUSCULAR | Status: AC
  Filled 2018-04-23: qty 1

## 2018-04-23 MED ORDER — ONDANSETRON HCL 4 MG/2ML IJ SOLN
INTRAMUSCULAR | Status: DC | PRN
  Administered 2018-04-23: 4 mg via INTRAVENOUS

## 2018-04-23 MED ORDER — LACTATED RINGERS IV SOLN
INTRAVENOUS | Status: DC
  Administered 2018-04-23: 10 mL/h via INTRAVENOUS
  Administered 2018-04-23 (×2): via INTRAVENOUS

## 2018-04-23 MED ORDER — CEFAZOLIN SODIUM-DEXTROSE 2-4 GM/100ML-% IV SOLN
INTRAVENOUS | Status: AC
Start: 2018-04-23 — End: 2018-04-23
  Filled 2018-04-23: qty 100

## 2018-04-23 MED ORDER — CLONIDINE HCL (ANALGESIA) 100 MCG/ML EP SOLN
EPIDURAL | Status: DC | PRN
  Administered 2018-04-23: 50 ug

## 2018-04-23 MED ORDER — METOCLOPRAMIDE HCL 5 MG/ML IJ SOLN: 10.0000 mg | Freq: Once | INTRAMUSCULAR | Status: DC | PRN

## 2018-04-23 MED ORDER — FENTANYL CITRATE (PF) 100 MCG/2ML IJ SOLN
INTRAMUSCULAR | Status: AC
  Filled 2018-04-23: qty 2

## 2018-04-23 MED ORDER — SUCCINYLCHOLINE CHLORIDE 200 MG/10ML IV SOSY
PREFILLED_SYRINGE | INTRAVENOUS | Status: AC
  Filled 2018-04-23: qty 10

## 2018-04-23 MED ORDER — PROPOFOL 10 MG/ML IV BOLUS
INTRAVENOUS | Status: AC
  Filled 2018-04-23: qty 20

## 2018-04-23 MED ORDER — MIDAZOLAM HCL 5 MG/5ML IJ SOLN
INTRAMUSCULAR | Status: DC | PRN
  Administered 2018-04-23: 2 mg via INTRAVENOUS

## 2018-04-23 MED ORDER — FENTANYL CITRATE (PF) 100 MCG/2ML IJ SOLN
50.0000 ug | INTRAMUSCULAR | Status: DC | PRN
  Administered 2018-04-23: 100 ug via INTRAVENOUS

## 2018-04-23 MED ORDER — BUPIVACAINE-EPINEPHRINE (PF) 0.5% -1:200000 IJ SOLN
INTRAMUSCULAR | Status: DC | PRN
  Administered 2018-04-23: 30 mL

## 2018-04-23 MED ORDER — FENTANYL CITRATE (PF) 100 MCG/2ML IJ SOLN
INTRAMUSCULAR | Status: DC | PRN
  Administered 2018-04-23: 50 ug via INTRAVENOUS

## 2018-04-23 MED ORDER — PROPOFOL 10 MG/ML IV BOLUS
INTRAVENOUS | Status: DC | PRN
  Administered 2018-04-23: 160 mg via INTRAVENOUS

## 2018-04-23 MED ORDER — MEPERIDINE HCL 25 MG/ML IJ SOLN: 6.2500 mg | INTRAMUSCULAR | Status: DC | PRN

## 2018-04-23 MED ORDER — ACETAMINOPHEN 325 MG PO TABS: 650.0000 mg | ORAL_TABLET | Freq: Four times a day (QID) | ORAL | Status: DC

## 2018-04-23 MED ORDER — LACTATED RINGERS IV SOLN: INTRAVENOUS | Status: DC

## 2018-04-23 MED ORDER — MIDAZOLAM HCL 2 MG/2ML IJ SOLN
1.0000 mg | INTRAMUSCULAR | Status: DC | PRN
  Administered 2018-04-23: 1 mg via INTRAVENOUS

## 2018-04-23 MED ORDER — OXYCODONE HCL 5 MG PO TABS: 0.0000 mg | ORAL_TABLET | Freq: Four times a day (QID) | ORAL | 0 refills | Status: DC | PRN

## 2018-04-23 MED ORDER — CEFAZOLIN SODIUM-DEXTROSE 2-4 GM/100ML-% IV SOLN
2.0000 g | INTRAVENOUS | Status: AC
  Administered 2018-04-23: 2 g via INTRAVENOUS

## 2018-04-23 MED ORDER — DEXAMETHASONE SODIUM PHOSPHATE 4 MG/ML IJ SOLN
INTRAMUSCULAR | Status: DC | PRN
  Administered 2018-04-23: 10 mg via INTRAVENOUS

## 2018-04-23 MED ORDER — SUCCINYLCHOLINE CHLORIDE 20 MG/ML IJ SOLN
INTRAMUSCULAR | Status: DC | PRN
  Administered 2018-04-23: 120 mg via INTRAVENOUS

## 2018-04-23 MED ORDER — CHLORHEXIDINE GLUCONATE 4 % EX LIQD: 60.0000 mL | Freq: Once | CUTANEOUS | Status: DC

## 2018-04-23 MED ORDER — PHENYLEPHRINE HCL 10 MG/ML IJ SOLN
INTRAMUSCULAR | Status: DC | PRN
  Administered 2018-04-23 (×7): 80 ug via INTRAVENOUS

## 2018-04-23 MED ORDER — LIDOCAINE 2% (20 MG/ML) 5 ML SYRINGE
INTRAMUSCULAR | Status: AC
  Filled 2018-04-23: qty 5

## 2018-04-23 MED ORDER — SCOPOLAMINE 1 MG/3DAYS TD PT72: 1.0000 | MEDICATED_PATCH | Freq: Once | TRANSDERMAL | Status: DC | PRN

## 2018-04-23 MED ORDER — DEXAMETHASONE SODIUM PHOSPHATE 10 MG/ML IJ SOLN
INTRAMUSCULAR | Status: AC
Start: 2018-04-23 — End: 2018-04-23
  Filled 2018-04-23: qty 1

## 2018-04-23 SURGICAL SUPPLY — 56 items
BENZOIN TINCTURE PRP APPL 2/3 (GAUZE/BANDAGES/DRESSINGS) IMPLANT
BLADE SURG 10 STRL SS (BLADE) IMPLANT
BLADE SURG 15 STRL LF DISP TIS (BLADE) ×1 IMPLANT
BLADE SURG 15 STRL SS (BLADE) ×2
BNDG GAUZE ELAST 4 BULKY (GAUZE/BANDAGES/DRESSINGS) ×3 IMPLANT
CLEANER CAUTERY TIP 5X5 PAD (MISCELLANEOUS) ×1 IMPLANT
CLOSURE WOUND 1/2 X4 (GAUZE/BANDAGES/DRESSINGS)
DERMABOND ADVANCED (GAUZE/BANDAGES/DRESSINGS)
DERMABOND ADVANCED .7 DNX12 (GAUZE/BANDAGES/DRESSINGS) IMPLANT
DRAPE C-ARM 42X72 X-RAY (DRAPES) ×3 IMPLANT
DRAPE IMP U-DRAPE 54X76 (DRAPES) IMPLANT
DRAPE SURG 17X23 STRL (DRAPES) ×3 IMPLANT
DRAPE U-SHAPE 47X51 STRL (DRAPES) ×3 IMPLANT
DRAPE U-SHAPE 76X120 STRL (DRAPES) ×6 IMPLANT
DRSG TEGADERM 4X4.75 (GAUZE/BANDAGES/DRESSINGS) ×3 IMPLANT
DURAPREP 26ML APPLICATOR (WOUND CARE) ×3 IMPLANT
ELECT REM PT RETURN 9FT ADLT (ELECTROSURGICAL) ×3
ELECTRODE REM PT RTRN 9FT ADLT (ELECTROSURGICAL) ×1 IMPLANT
FIBERSTICK 2 (SUTURE) ×3 IMPLANT
GAUZE SPONGE 4X4 12PLY STRL LF (GAUZE/BANDAGES/DRESSINGS) ×3 IMPLANT
GLOVE BIO SURGEON STRL SZ7.5 (GLOVE) ×3 IMPLANT
GLOVE BIOGEL PI IND STRL 7.0 (GLOVE) ×3 IMPLANT
GLOVE BIOGEL PI IND STRL 8 (GLOVE) ×1 IMPLANT
GLOVE BIOGEL PI INDICATOR 7.0 (GLOVE) ×6
GLOVE BIOGEL PI INDICATOR 8 (GLOVE) ×2
GLOVE ECLIPSE 6.5 STRL STRAW (GLOVE) ×6 IMPLANT
GOWN STRL REUS W/ TWL LRG LVL3 (GOWN DISPOSABLE) ×2 IMPLANT
GOWN STRL REUS W/TWL LRG LVL3 (GOWN DISPOSABLE) ×4
GOWN STRL REUS W/TWL XL LVL3 (GOWN DISPOSABLE) ×3 IMPLANT
NS IRRIG 1000ML POUR BTL (IV SOLUTION) ×3 IMPLANT
PACK ARTHROSCOPY DSU (CUSTOM PROCEDURE TRAY) ×3 IMPLANT
PACK BASIN DAY SURGERY FS (CUSTOM PROCEDURE TRAY) ×3 IMPLANT
PAD CLEANER CAUTERY TIP 5X5 (MISCELLANEOUS) ×2
PENCIL BUTTON HOLSTER BLD 10FT (ELECTRODE) ×3 IMPLANT
SLEEVE SCD COMPRESS KNEE MED (MISCELLANEOUS) ×3 IMPLANT
SLING ARM FOAM STRAP LRG (SOFTGOODS) IMPLANT
SLING ARM IMMOBILIZER MED (SOFTGOODS) IMPLANT
SLING ARM MED ADULT FOAM STRAP (SOFTGOODS) IMPLANT
SPONGE LAP 18X18 RF (DISPOSABLE) ×3 IMPLANT
SPONGE LAP 4X18 RFD (DISPOSABLE) IMPLANT
STRIP CLOSURE SKIN 1/2X4 (GAUZE/BANDAGES/DRESSINGS) IMPLANT
SUCTION FRAZIER HANDLE 10FR (MISCELLANEOUS) ×2
SUCTION TUBE FRAZIER 10FR DISP (MISCELLANEOUS) ×1 IMPLANT
SUT FIBERWIRE #2 38 T-5 BLUE (SUTURE)
SUT VIC AB 0 CT1 18XCR BRD 8 (SUTURE) IMPLANT
SUT VIC AB 0 CT1 8-18 (SUTURE)
SUT VIC AB 2-0 SH 27 (SUTURE)
SUT VIC AB 2-0 SH 27XBRD (SUTURE) IMPLANT
SUT VIC AB 3-0 SH 27 (SUTURE)
SUT VIC AB 3-0 SH 27X BRD (SUTURE) IMPLANT
SUT VICRYL RAPIDE 4/0 PS 2 (SUTURE) IMPLANT
SUTURE FIBERWR #2 38 T-5 BLUE (SUTURE) IMPLANT
SYR BULB 3OZ (MISCELLANEOUS) ×3 IMPLANT
TOWEL GREEN STERILE FF (TOWEL DISPOSABLE) ×3 IMPLANT
TOWEL OR NON WOVEN STRL DISP B (DISPOSABLE) ×3 IMPLANT
knotless ac repair system (Orthopedic Implant) ×3 IMPLANT

## 2018-04-23 NOTE — Anesthesia Postprocedure Evaluation (Signed)
Anesthesia Post Note  Patient: Salley Melin  Procedure(s) Performed: OPEN TREATMENT OF DISTAL CLAVICLE FRACTURE (Right Shoulder)     Patient location during evaluation: PACU Anesthesia Type: General Level of consciousness: awake and alert Pain management: pain level controlled Vital Signs Assessment: post-procedure vital signs reviewed and stable Respiratory status: spontaneous breathing, nonlabored ventilation and respiratory function stable Cardiovascular status: blood pressure returned to baseline and stable Postop Assessment: no apparent nausea or vomiting Anesthetic complications: no    Last Vitals:  Vitals:   04/23/18 0945 04/23/18 1000  BP: 137/79 132/76  Pulse: 83 82  Resp: 17 17  Temp:    SpO2: 98% 94%    Last Pain:  Vitals:   04/23/18 1005  TempSrc:   PainSc: 7                  Lavinia Mcneely A.

## 2018-04-23 NOTE — Op Note (Signed)
04/23/2018  7:36 AM  PATIENT:  April Daniel  52 y.o. female  PRE-OPERATIVE DIAGNOSIS:  Displaced right distal clavicle fx  POST-OPERATIVE DIAGNOSIS:  Same  PROCEDURE:  ORIF R distal clavicle fx  SURGEON: Cliffton Asters. Janee Morn, MD  PHYSICIAN ASSISTANT: Danielle Rankin, OPA-C  ANESTHESIA:  regional and general  SPECIMENS:  None  DRAINS:   None  EBL:  less than 50 mL  PREOPERATIVE INDICATIONS:  [name] is a  52 y.o. female with a displaced right distal clavicle fracture.  The risks benefits and alternatives were discussed with the patient preoperatively including but not limited to the risks of infection, bleeding, nerve injury, cardiopulmonary complications, the need for revision surgery, among others, and the patient verbalized understanding and consented to proceed.  OPERATIVE IMPLANTS: Arthrex low-profile AC tightrope  OPERATIVE PROCEDURE:  After receiving prophylactic antibiotics and a regional block, the patient was escorted to the operative theatre and placed in a supine position.  General anesthesia was administered. A surgical "time-out" was performed during which the planned procedure, proposed operative site, and the correct patient identity were compared to the operative consent and agreement confirmed by the circulating nurse according to current facility policy.   The exposed skin was prepped with Duraprep and draped in the usual sterile fashion.    An incision was marked and made along Langer's lines overlying the clavicle fracture/coracoid.  Skin was incised sharply with a scalpel and Bovie electrocautery, down to the fascia.  The deltoid was then split in line with its fibers over the CC interval and the coracoid identified.  The superior aspect of it was cleaned enough to ascertain its boundaries and the hole for passage of the tight rope was made with a drill.  The hole was then made separately through the distal clavicle, using fluoroscopic guidance.  The  wire was then passed allowing for passage of the tight rope tails and the dog bone was placed upon the tight rope.  A dog bone was then nestled to the undersurface of the acromion and the fracture was held reduced by lifting the elbow and also with a bruise clamp.  The tight rope was tightened, and and nestled into place.  Once satisfied with the degree of reduction, the sutures were tied with 3-4/2 hitches before being cut.  Final images were obtained revealing satisfactory reapproximation of the 2 follicular fragments.  The CC interval was reduced.  The wound was copiously irrigated and 2-0 Vicryl suture used to reapproximate the deltoid split.  The skin was then reapproximated with 2-0 Vicryl deep dermal buried subcuticular sutures and a running 4-0 Vicryl Rapide subcuticular suture before benzoin and Steri-Strips.  A Mepilex dressing was applied and she was placed into a sling.  She was awakened and taken to the recovery room in stable condition, breathing spontaneously.  DISPOSITION: She will be discharged home today, with instructions to stay in the sling full-time, returning in 10 to 15 days for reevaluation, with new x-rays of the right clavicle without weights.

## 2018-04-23 NOTE — Interval H&P Note (Signed)
History and Physical Interval Note:  04/23/2018 7:35 AM  April Daniel  has presented today for surgery, with the diagnosis of DISPLACED RIGHT DISTAL CLAVICLE FRACTURE  The various methods of treatment have been discussed with the patient and family. After consideration of risks, benefits and other options for treatment, the patient has consented to  Procedure(s) with comments: OPEN TREATMENT OF DISTAL CLAVICLE FRACTURE WITH POSSIBLE DISTAL CLAVICLE EXCISION (Right) - ANESTHESIA: PRE-OP BLOCK as a surgical intervention .  The patient's history has been reviewed, patient examined, no change in status, stable for surgery.  I have reviewed the patient's chart and labs.  Questions were answered to the patient's satisfaction.     Jodi Marble

## 2018-04-23 NOTE — Progress Notes (Signed)
Assisted Dr. Foster with right, ultrasound guided, supraclavicular block. Side rails up, monitors on throughout procedure. See vital signs in flow sheet. Tolerated Procedure well. °

## 2018-04-23 NOTE — Transfer of Care (Signed)
Immediate Anesthesia Transfer of Care Note  Patient: April Daniel  Procedure(s) Performed: OPEN TREATMENT OF DISTAL CLAVICLE FRACTURE (Right Shoulder)  Patient Location: PACU  Anesthesia Type:GA combined with regional for post-op pain  Level of Consciousness: awake and patient cooperative  Airway & Oxygen Therapy: Patient Spontanous Breathing and Patient connected to face mask oxygen  Post-op Assessment: Report given to RN and Post -op Vital signs reviewed and stable  Post vital signs: Reviewed and stable  Last Vitals:  Vitals Value Taken Time  BP    Temp    Pulse 84 04/23/2018  9:25 AM  Resp    SpO2 99 % 04/23/2018  9:25 AM  Vitals shown include unvalidated device data.  Last Pain:  Vitals:   04/23/18 0710  TempSrc: Oral  PainSc: 0-No pain         Complications: No apparent anesthesia complications

## 2018-04-23 NOTE — Anesthesia Procedure Notes (Signed)
Anesthesia Regional Block: Supraclavicular block   Pre-Anesthetic Checklist: ,, timeout performed, Correct Patient, Correct Site, Correct Laterality, Correct Procedure, Correct Position, site marked, Risks and benefits discussed,  Surgical consent,  Pre-op evaluation,  At surgeon's request and post-op pain management  Laterality: Right  Prep: chloraprep       Needles:  Injection technique: Single-shot  Needle Type: Echogenic Stimulator Needle     Needle Length: 9cm  Needle Gauge: 21   Needle insertion depth: 6 cm   Additional Needles:   Procedures:,,,, ultrasound used (permanent image in chart),,,,  Narrative:  Start time: 04/23/2018 7:18 AM End time: 04/23/2018 7:23 AM Injection made incrementally with aspirations every 5 mL.  Performed by: Personally  Anesthesiologist: Mal Amabile, MD  Additional Notes: Timeout performed. Patient sedated. Relevant anatomy ID'd using [place]. Incremental 2-2ml injection of LA with frequent aspiration. Patient tolerated procedure well.        Right Supraclavicular Block

## 2018-04-23 NOTE — Discharge Instructions (Addendum)
Discharge Instructions   You have a dressing on your shoulder. You may begin gentle motion of your fingers and hand immediately, but you should not do any heavy lifting or gripping.  Elevate your hand to reduce pain & swelling of the digits.  Ice over the operative site may be helpful to reduce pain & swelling.  DO NOT USE HEAT. Leave the dressing in place until the third day after your surgery and then remove it, leaving it open to air.  After the bandage has been removed you may shower, regularly washing the incision and letting the water run over it, but not submerging it (no swimming, soaking it in dishwater, etc.) YOU MUST REMAIN IN YOUR SLING FULL-TIME, EXCEPT FOR SHOWERING.  REMOVE THE SLING FROM YOUR ELBOW SEVERAL TIMES DAILY TO MOVE JUST YOUR ELBOW / WRIST / HAND / DIGITS We will address whether therapy will be required or not when you return to the office. You may have already made your follow-up appointment when we completed your preop visit.  If not, please call our office today or the next business day to make your return appointment for 10-15 days after surgery.   Please call [phone] during normal business hours or [phone] after hours for any problems. Including the following:  - excessive redness of the incisions - drainage for more than 4 days - fever of more than 101.5 F  *Please note that pain medications will not be refilled after hours or on weekends.   Post Anesthesia Home Care Instructions  Activity: Get plenty of rest for the remainder of the day. A responsible individual must stay with you for 24 hours following the procedure.  For the next 24 hours, DO NOT: -Drive a car -Advertising copywriter -Drink alcoholic beverages -Take any medication unless instructed by your physician -Make any legal decisions or sign important papers.  Meals: Start with liquid foods such as gelatin or soup. Progress to regular foods as tolerated. Avoid greasy, spicy, heavy  foods. If nausea and/or vomiting occur, drink only clear liquids until the nausea and/or vomiting subsides. Call your physician if vomiting continues.  Special Instructions/Symptoms: Your throat may feel dry or sore from the anesthesia or the breathing tube placed in your throat during surgery. If this causes discomfort, gargle with warm salt water. The discomfort should disappear within 24 hours.  If you had a scopolamine patch placed behind your ear for the management of post- operative nausea and/or vomiting:  1. The medication in the patch is effective for 72 hours, after which it should be removed.  Wrap patch in a tissue and discard in the trash. Wash hands thoroughly with soap and water. 2. You may remove the patch earlier than 72 hours if you experience unpleasant side effects which may include dry mouth, dizziness or visual disturbances. 3. Avoid touching the patch. Wash your hands with soap and water after contact with the patch.   Regional Anesthesia Blocks  1. Numbness or the inability to move the "blocked" extremity may last from 3-48 hours after placement. The length of time depends on the medication injected and your individual response to the medication. If the numbness is not going away after 48 hours, call your surgeon.  2. The extremity that is blocked will need to be protected until the numbness is gone and the  Strength has returned. Because you cannot feel it, you will need to take extra care to avoid injury. Because it may be weak, you may have difficulty moving  it or using it. You may not know what position it is in without looking at it while the block is in effect.  3. For blocks in the legs and feet, returning to weight bearing and walking needs to be done carefully. You will need to wait until the numbness is entirely gone and the strength has returned. You should be able to move your leg and foot normally before you try and bear weight or walk. You will need someone to  be with you when you first try to ensure you do not fall and possibly risk injury.  4. Bruising and tenderness at the needle site are common side effects and will resolve in a few days.  5. Persistent numbness or new problems with movement should be communicated to the surgeon or the [facility] [phone] [facility] [phone]).

## 2018-04-23 NOTE — Anesthesia Procedure Notes (Signed)
Procedure Name: Intubation Date/Time: 04/23/2018 7:46 AM Performed by: [place] Desanctis, CRNA Pre-anesthesia Checklist: Patient identified, Emergency Drugs available, Suction available, Patient being monitored and Timeout performed Patient Re-evaluated:Patient Re-evaluated prior to induction Oxygen Delivery Method: Circle system utilized Preoxygenation: Pre-oxygenation with 100% oxygen Induction Type: IV induction Ventilation: Mask ventilation without difficulty Laryngoscope Size: Miller and 3 Grade View: Grade II Tube type: Oral Tube size: 7.0 mm Number of attempts: 1 Airway Equipment and Method: Stylet and Oral airway Placement Confirmation: ETT inserted through vocal cords under direct vision,  positive ETCO2 and breath sounds checked- equal and bilateral Secured at: 22 cm Tube secured with: Tape Dental Injury: Teeth and Oropharynx as per pre-operative assessment

## 2018-04-25 ENCOUNTER — Encounter (HOSPITAL_BASED_OUTPATIENT_CLINIC_OR_DEPARTMENT_OTHER): Payer: Self-pay | Admitting: Orthopedic Surgery

## 2018-06-15 ENCOUNTER — Other Ambulatory Visit: Payer: Self-pay | Admitting: Physician Assistant

## 2018-06-15 ENCOUNTER — Other Ambulatory Visit (HOSPITAL_COMMUNITY)
Admission: RE | Admit: 2018-06-15 | Discharge: 2018-06-15 | Disposition: A | Discharge status: Home / Self Care | Payer: 59 | Source: Ambulatory Visit | Attending: Physician Assistant | Admitting: Physician Assistant

## 2018-06-15 DIAGNOSIS — Z01419 Encounter for gynecological examination (general) (routine) without abnormal findings: Secondary | ICD-10-CM | POA: Insufficient documentation

## 2018-06-18 LAB — CYTOLOGY - PAP: DIAGNOSIS: NEGATIVE

## 2018-08-17 ENCOUNTER — Other Ambulatory Visit: Payer: Self-pay | Admitting: Physician Assistant

## 2018-08-17 DIAGNOSIS — Z1231 Encounter for screening mammogram for malignant neoplasm of breast: Secondary | ICD-10-CM

## 2018-09-13 ENCOUNTER — Ambulatory Visit
Admission: RE | Admit: 2018-09-13 | Discharge: 2018-09-13 | Disposition: A | Discharge status: Home / Self Care | Payer: 59 | Source: Ambulatory Visit | Attending: Physician Assistant | Admitting: Physician Assistant

## 2018-09-13 ENCOUNTER — Encounter: Payer: Self-pay | Admitting: Radiology

## 2018-09-13 DIAGNOSIS — Z1231 Encounter for screening mammogram for malignant neoplasm of breast: Secondary | ICD-10-CM

## 2018-12-21 IMAGING — DX DG FINGER INDEX 2+V*L*
3 series · 3 of 3 positions shown · non-contrast
Comparison: Earlier the same date.

CLINICAL DATA: Left index finger foreign body removal.

EXAM:
LEFT INDEX FINGER 2+V

[finger ap]
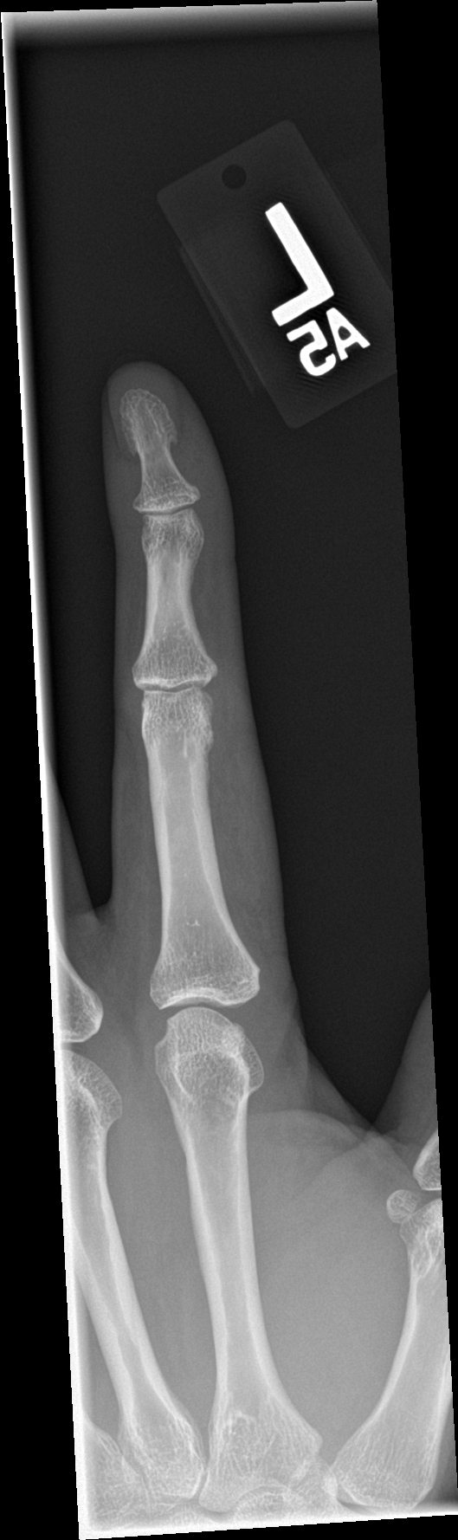

[finger obl]
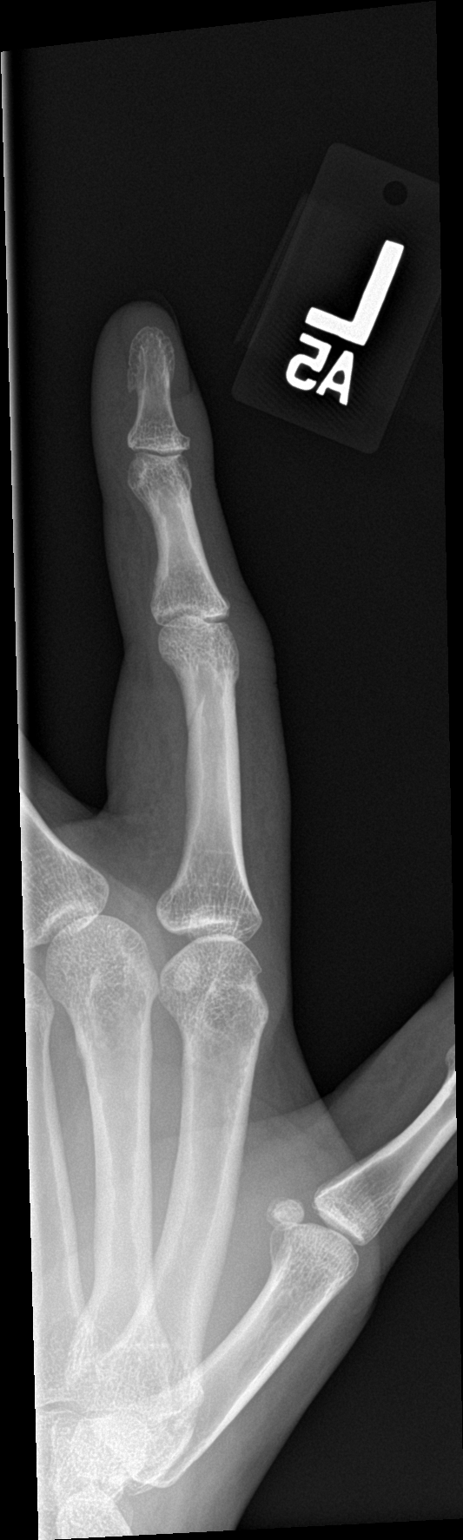

[finger lat]
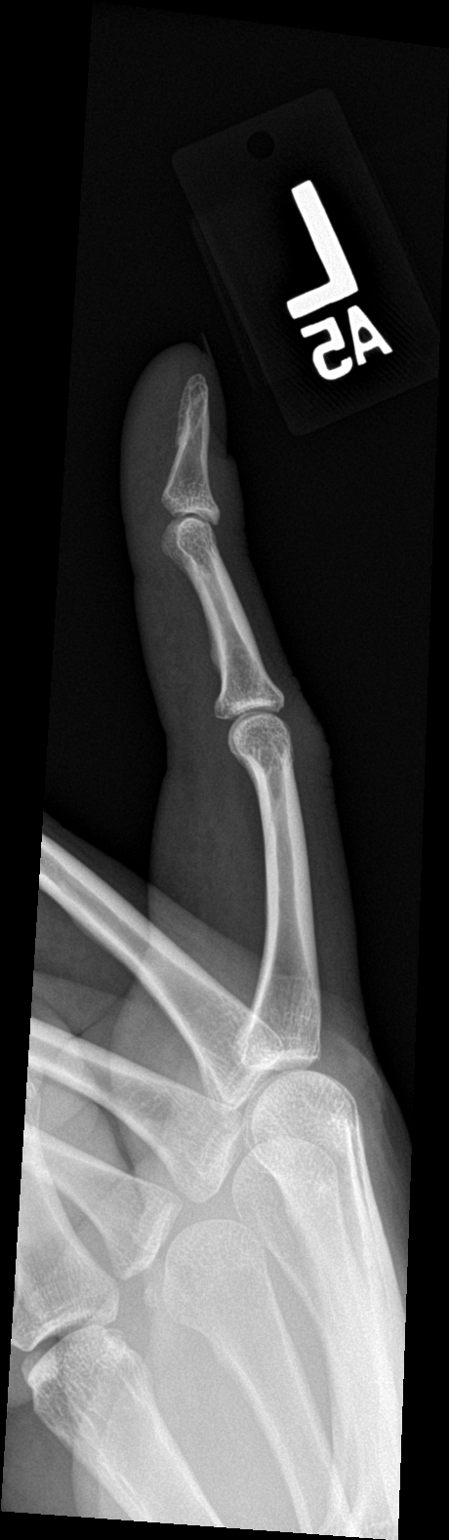

[3 of 3 positions shown; findings below may reference images not displayed]

FINDINGS: Examination at 5355 hours demonstrates interval removal of the
metallic knitting needle from the index finger. No retained foreign
body is demonstrated. No evidence of acute fracture, dislocation or
bone destruction. Mild soft tissue swelling.
IMPRESSION: Successful foreign body removal.  No acute osseous findings.

## 2019-01-15 ENCOUNTER — Other Ambulatory Visit: Payer: Self-pay | Admitting: Internal Medicine

## 2019-01-15 DIAGNOSIS — Z20822 Contact with and (suspected) exposure to covid-19: Secondary | ICD-10-CM

## 2019-01-18 LAB — NOVEL CORONAVIRUS, NAA: SARS-CoV-2, NAA: NOT DETECTED

## 2019-01-22 ENCOUNTER — Telehealth: Payer: Self-pay | Admitting: Hematology

## 2019-01-22 NOTE — Telephone Encounter (Signed)
Pt is aware of her covid 19 results negative

## 2019-10-10 ENCOUNTER — Other Ambulatory Visit: Payer: Self-pay | Admitting: Physician Assistant

## 2019-10-10 DIAGNOSIS — Z1231 Encounter for screening mammogram for malignant neoplasm of breast: Secondary | ICD-10-CM

## 2019-10-15 ENCOUNTER — Ambulatory Visit
Admission: RE | Admit: 2019-10-15 | Discharge: 2019-10-15 | Disposition: A | Discharge status: Home / Self Care | Payer: 59 | Source: Ambulatory Visit | Attending: Physician Assistant | Admitting: Physician Assistant

## 2019-10-15 ENCOUNTER — Other Ambulatory Visit: Payer: Self-pay

## 2019-10-15 DIAGNOSIS — Z1231 Encounter for screening mammogram for malignant neoplasm of breast: Secondary | ICD-10-CM

## 2019-10-17 ENCOUNTER — Encounter: Payer: 59 | Attending: Physician Assistant | Admitting: Registered"

## 2019-10-17 ENCOUNTER — Encounter: Payer: Self-pay | Admitting: Registered"

## 2019-10-17 ENCOUNTER — Other Ambulatory Visit: Payer: Self-pay

## 2019-10-17 DIAGNOSIS — E1169 Type 2 diabetes mellitus with other specified complication: Secondary | ICD-10-CM | POA: Insufficient documentation

## 2019-10-17 NOTE — Progress Notes (Addendum)
Diabetes Self-Management Education  Visit Type: First/Initial  Appt. Start Time: 1523 Appt. End Time: 1630  10/23/2019  April Daniel, identified by name and date of birth, is a 54 y.o. female with a diagnosis of Diabetes: Type 2.   ASSESSMENT  There were no vitals taken for this visit. There is no height or weight on file to calculate BMI.   Pt recently dx with diabetes in February. Pt's reports her sister and mother have diabetes as well.   Pt is not currently checking blood sugar. Is open to checking. Reports she was around mother when she used to check, however, didn't pay a lot of attention.   Pt wakes between 5 and 6 AM. Often does not eat until 9 AM or 1130 AM. Pt eats 2 meals per day, usually no snacks. Beverages usually include water, cranberry juice.   Reports all dairy causes constipation and also bread. Reports she tried A2 milk and did not agree with pt. Pt takes Miralax.   No corn, peanut butter or peanuts, chocolate, banana (heart burn) dairy due to causing GI issues per pt. Peanuts causes rectal itching. Reports almonds, cashews, pistachios are tolerated.   Walks 2-3 days x 45-60 minutes per week. Started since weather has warmed up.   Pertinent Lab Values:  09/20/19:  HgbA1c: 6.5  06/18/19:  LDL Cholesterol: 108 (H) HgbA1c: 6.5  Diabetes Self-Management Education - 10/17/19 1530      Visit Information   Visit Type  First/Initial      Initial Visit   Diabetes Type  Type 2    Are you currently following a meal plan?  No    Are you taking your medications as prescribed?  Yes    Date Diagnosed  08/31/19      Health Coping   How would you rate your overall health?  Fair      Psychosocial Assessment   Patient Belief/Attitude about Diabetes  Motivated to manage diabetes    Self-care barriers  None    Self-management support  None    Other persons present  Patient    Patient Concerns  Nutrition/Meal planning    Special Needs  None    Preferred  Learning Style  No preference indicated    Learning Readiness  Ready    How often do you need to have someone help you when you read instructions, pamphlets, or other written materials from your doctor or pharmacy?  2 - Rarely    What is the last grade level you completed in school?  12th grade      Pre-Education Assessment   Patient understands the diabetes disease and treatment process.  Needs Instruction    Patient understands incorporating nutritional management into lifestyle.  Needs Instruction    Patient undertands incorporating physical activity into lifestyle.  Needs Instruction    Patient understands using medications safely.  Demonstrates understanding / competency    Patient understands monitoring blood glucose, interpreting and using results  Needs Instruction    Patient understands prevention, detection, and treatment of acute complications.  Needs Instruction    Patient understands prevention, detection, and treatment of chronic complications.  Needs Instruction    Patient understands how to develop strategies to address psychosocial issues.  Needs Instruction    Patient understands how to develop strategies to promote health/change behavior.  Needs Instruction      Complications   Last HgB A1C per patient/outside source  6.5 %    How often do you check  your blood sugar?  0 times/day (not testing)    Fasting Blood glucose range (mg/dL)  --   Pt not currently checking.   Postprandial Blood glucose range (mg/dL)  --   Pt not currently checking.   Number of hypoglycemic episodes per month  --   None reported.   Number of hyperglycemic episodes per week  --   Pt not currently checking.   Have you had a dilated eye exam in the past 12 months?  No    Have you had a dental exam in the past 12 months?  No    Are you checking your feet?  Yes    How many days per week are you checking your feet?  7      Dietary Intake   Breakfast  None reported.    Snack (morning)  None  reported.    Lunch  1130 AM: [place] sausage and egg biscuit, water    Snack (afternoon)  None reported.    Dinner  7 PM: baked pork chops, turnip greens, less than 1/2 cup mac and cheese, water    Snack (evening)  None reported.    Beverage(s)  8 oz cranberry juice mixed with 8 oz water, 3 bottles water (56 oz)      Exercise   Exercise Type  Light (walking / raking leaves);ADL's    How many days per week to you exercise?  3    How many minutes per day do you exercise?  45    Total minutes per week of exercise  135      Patient Education   Previous Diabetes Education  No    Disease state   Definition of diabetes, type 1 and 2, and the diagnosis of diabetes;Factors that contribute to the development of diabetes    Nutrition management   Carbohydrate counting;Food label reading, portion sizes and measuring food.;Role of diet in the treatment of diabetes and the relationship between the three main macronutrients and blood glucose level    Physical activity and exercise   Role of exercise on diabetes management, blood pressure control and cardiac health.    Monitoring  Purpose and frequency of SMBG.;Taught/discussed recording of test results and interpretation of SMBG.;Interpreting lab values - A1C, lipid, urine microalbumina.;Identified appropriate SMBG and/or A1C goals.;Daily foot exams;Yearly dilated eye exam    Acute complications  Taught treatment of hypoglycemia - the 15 rule.;Discussed and identified patients' treatment of hyperglycemia.    Chronic complications  Relationship between chronic complications and blood glucose control;Lipid levels, blood glucose control and heart disease;Assessed and discussed foot care and prevention of foot problems;Dental care;Retinopathy and reason for yearly dilated eye exams      Individualized Goals (developed by patient)   Nutrition  Follow meal plan discussed;General guidelines for healthy choices and portions discussed    Physical Activity   Exercise 3-5 times per week;30 minutes per day    Medications  take my medication as prescribed    Monitoring   test my blood glucose as discussed      Post-Education Assessment   Patient understands the diabetes disease and treatment process.  Demonstrates understanding / competency    Patient understands incorporating nutritional management into lifestyle.  Demonstrates understanding / competency    Patient undertands incorporating physical activity into lifestyle.  Demonstrates understanding / competency    Patient understands using medications safely.  Demonstrates understanding / competency    Patient understands monitoring blood glucose, interpreting and using results  Demonstrates understanding /  competency    Patient understands prevention, detection, and treatment of acute complications.  Demonstrates understanding / competency    Patient understands prevention, detection, and treatment of chronic complications.  Demonstrates understanding / competency    Patient understands how to develop strategies to address psychosocial issues.  Demonstrates understanding / competency    Patient understands how to develop strategies to promote health/change behavior.  Demonstrates understanding / competency      Outcomes   Expected Outcomes  Demonstrated interest in learning. Expect positive outcomes    Future DMSE  PRN    Program Status  Completed       Individualized Plan for Diabetes Self-Management Training:   Learning Objective:  Patient will have a greater understanding of diabetes self-management. Patient education plan is to attend individual and/or group sessions per assessed needs and concerns.   Plan: Provided DSME. Worked with pt to set goals. Recommended pt check with insurance about which meter is covered and ask doctor for prescription for meter, lancets, and strips. Let pt know she can call if questions about using meter. Provided education regarding nutrition to help with  constipation. Pt appeared agreeable to information/goals discussed.   Instructions/Goals:   -Eat every 3-5 hours/3 meals and may have a snack in between if meals are spaced more than 5 hours apart.   -If you don't feel like eating a meal, try to at least have a balanced snack to keep blood sugar maintained (see handout)  -Ideas for morning meal: Try to eat within 1 hour of waking  Apple or another fruit and almond butter   Special K Protein Cereals (can do dry or could try with rice milk)  Oatmeal and almonds  -To help with constipation:   Increase intake of fiber: fruits, vegetables, whole grains  Stay well hydrated: may add in another bottle of water  Continue with physical activity   -Recommend cutting down morning juice to 1/2 cup  -Recommend 2-3 carbohydrate choices per meal (30-45 g per meal). Consistency at each meal is key.   -Recommend checking with insurance about which meter is covered, then asking your doctor about a prescription for a glucose meter, strips, and lancets to check blood sugar. Recommend checking 1 time fasting in the morning before eating or drinking and 1 time 1-2 hours after a meal.   -Continue including regular physical activity!   Patient Instructions  Instructions/Goals:   -Eat every 3-5 hours/3 meals and may have a snack in between if meals are spaced more than 5 hours apart.   -If you don't feel like eating a meal, try to at least have a balanced snack to keep blood sugar maintained (see handout)  -Ideas for morning meal: Try to eat within 1 hour of waking  Apple or another fruit and almond butter   Special K Protein Cereals (can do dry or could try with rice milk)  Oatmeal and almonds  -To help with constipation:   Increase intake of fiber: fruits, vegetables, whole grains  Stay well hydrated: may add in another bottle of water  Continue with physical activity   -Recommend cutting down morning juice to 1/2 cup  -Recommend 2-3  carbohydrate choices per meal (30-45 g per meal). Consistency at each meal is key.   -Recommend checking with insurance about which meter is covered, then asking your doctor about a prescription for a glucose meter, strips, and lancets to check blood sugar. Recommend checking 1 time fasting in the morning before eating or drinking  and 1 time 1-2 hours after a meal.   -Continue including regular physical activity!   Expected Outcomes:  Demonstrated interest in learning. Expect positive outcomes  Education material provided: ADA - How to Thrive: A Guide for Your Journey with Diabetes, Meal plan card, My Plate and Snack sheet  If problems or questions, patient to contact team via:  Phone and Email  Future DSME appointment: PRN

## 2019-10-17 NOTE — Patient Instructions (Addendum)
Instructions/Goals:   -Eat every 3-5 hours/3 meals and may have a snack in between if meals are spaced more than 5 hours apart.   -If you don't feel like eating a meal, try to at least have a balanced snack to keep blood sugar maintained (see handout)   -Ideas for morning meal: Try to eat within 1 hour of waking  Apple or another fruit and almond butter   Special K Protein Cereals (can do dry or could try with rice milk)  Oatmeal and almonds  -To help with constipation:   Increase intake of fiber: fruits, vegetables, whole grains  Stay well hydrated: may add in another bottle of water  Continue with physical activity   -Recommend cutting down morning juice to 1/2 cup  -Recommend 2-3 carbohydrate choices per meal (30-45 g per meal). Consistency at each meal is key.   -Recommend checking with insurance about which meter is covered, then asking your doctor about a prescription for a glucose meter, strips, and lancets to check blood sugar. Recommend checking 1 time fasting in the morning before eating or drinking and 1 time 1-2 hours after a meal.   -Continue including regular physical activity!

## 2019-11-18 IMAGING — DX DG SHOULDER 2+V*R*
3 series · 3 of 3 positions shown · non-contrast
Comparison: None.

CLINICAL DATA: Acute right shoulder pain

EXAM:
RIGHT SHOULDER - 2+ VIEW

[shoulder ap (1 of 2)]
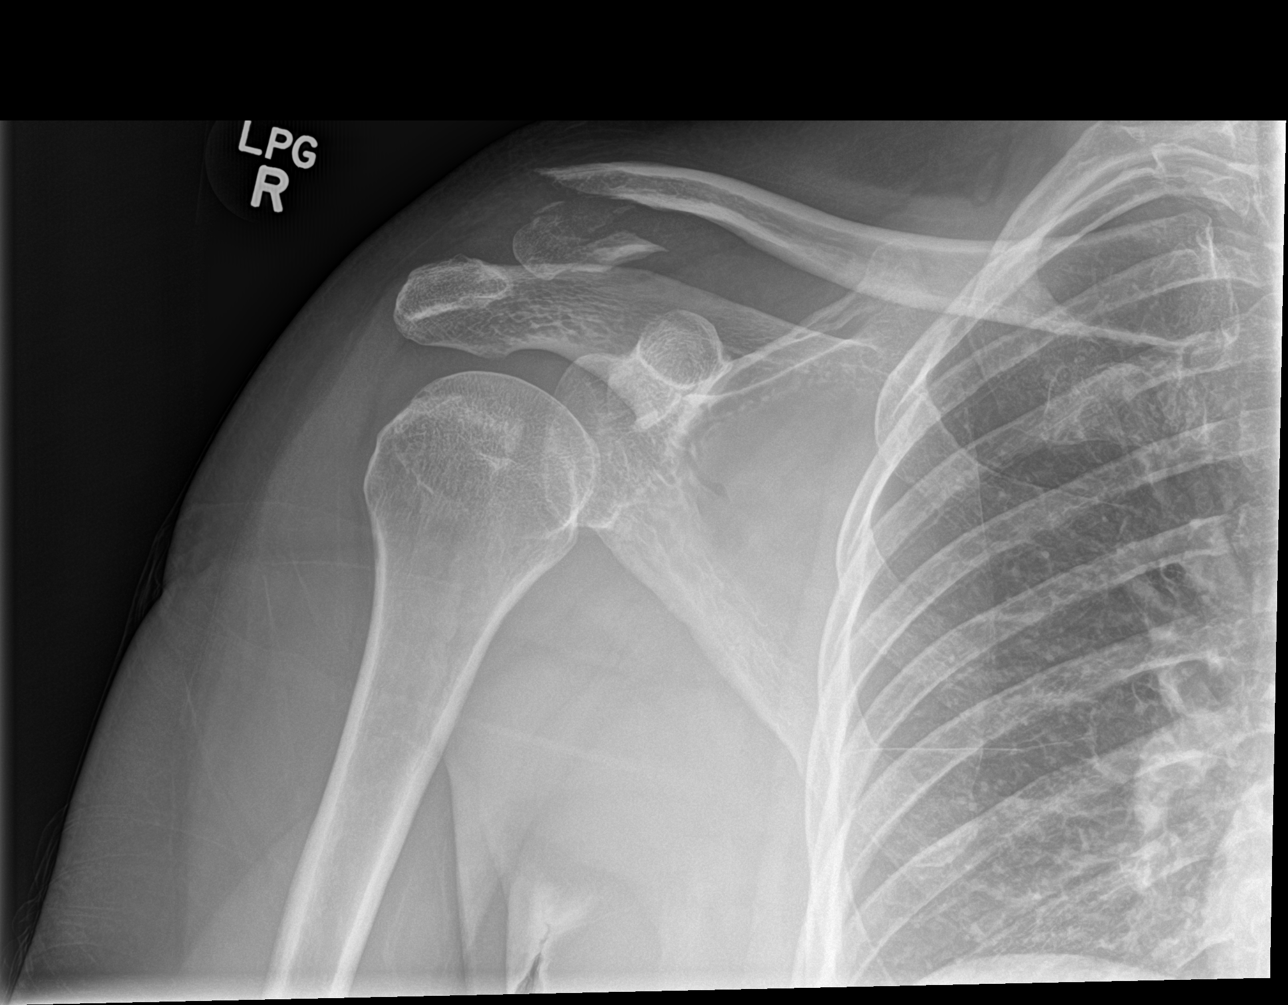

[shoulder ap (2 of 2)]
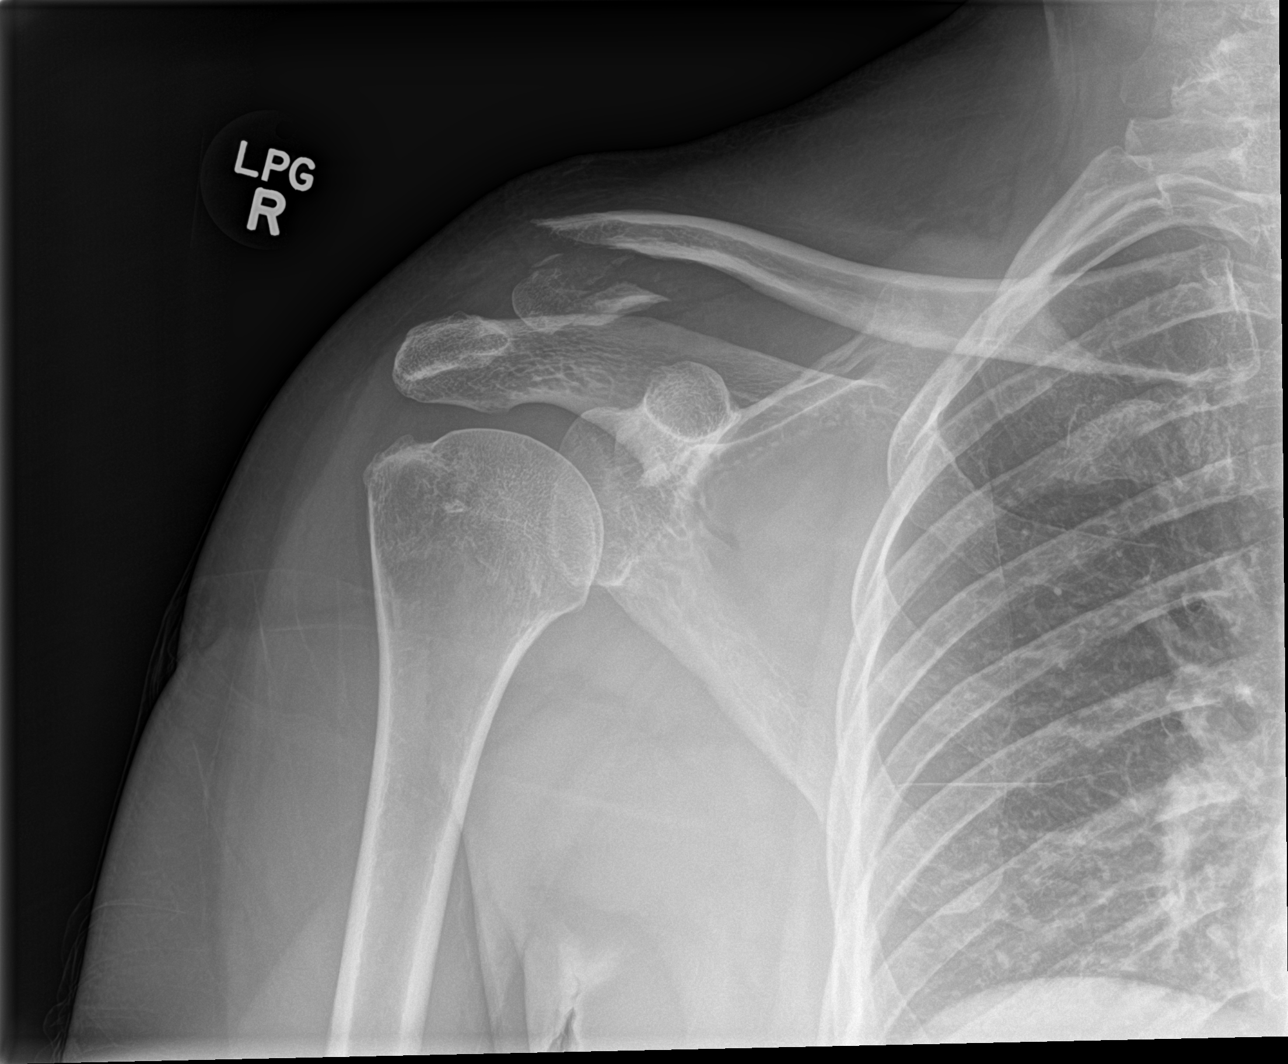

[shoulder y-view]
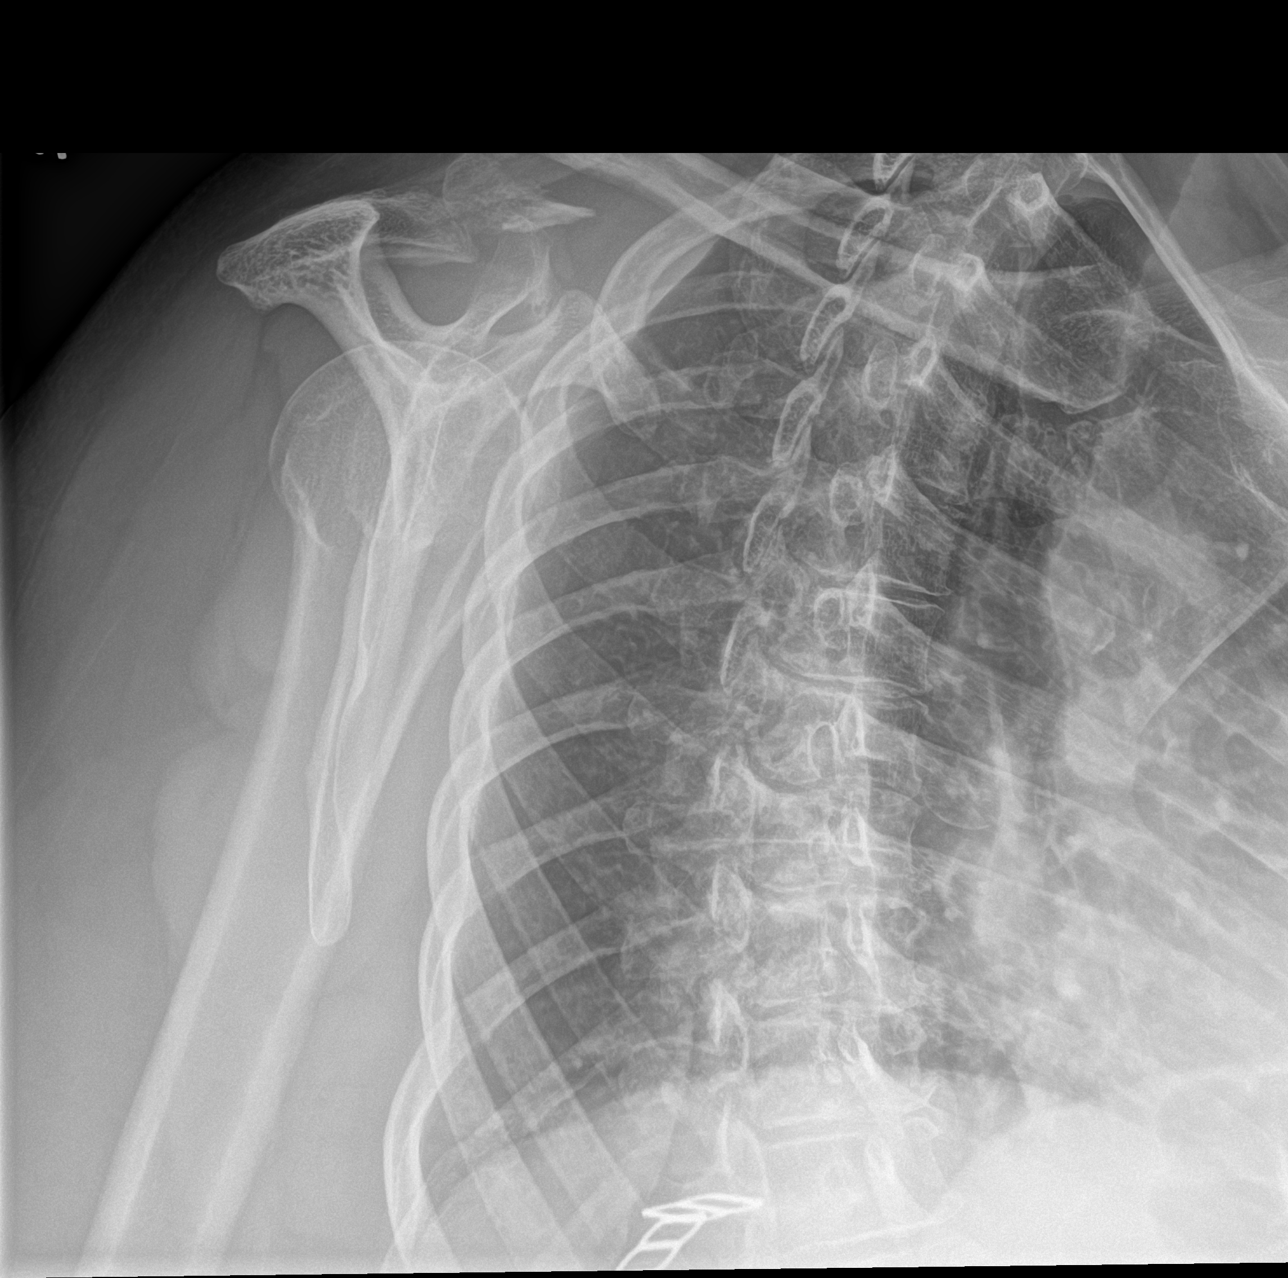

[3 of 3 positions shown; findings below may reference images not displayed]

FINDINGS: Acute fracture of the lateral third right clavicle with 100%
displacement. Coracoclavicular interval widening to 25 mm. Located
glenohumeral joint without fracture.
IMPRESSION: Displaced lateral third clavicle fracture with cortical clavicular
ligament disruption.

## 2019-11-21 ENCOUNTER — Encounter: Payer: Self-pay | Admitting: Registered"

## 2019-11-21 ENCOUNTER — Encounter: Payer: 59 | Attending: Physician Assistant | Admitting: Registered"

## 2019-11-21 ENCOUNTER — Other Ambulatory Visit: Payer: Self-pay

## 2019-11-21 DIAGNOSIS — E1169 Type 2 diabetes mellitus with other specified complication: Secondary | ICD-10-CM | POA: Diagnosis present

## 2019-11-21 DIAGNOSIS — E118 Type 2 diabetes mellitus with unspecified complications: Secondary | ICD-10-CM

## 2019-11-21 NOTE — Progress Notes (Signed)
Diabetes Self-Management Education  Visit Type:    Appt. Start Time: 1630 Appt. End Time: 1700  11/21/2019  [name], identified by name and date of birth, is a 54 y.o. female with a diagnosis of Diabetes:  .   ASSESSMENT  There were no vitals taken for this visit. There is no height or weight on file to calculate BMI.   Nutrition Follow-Up:  Pt reports now getting 2-3 meals per day. Reports now eating lunch and dinner. No snacks typically. Has not yet checked about glucose meter but plans to do so. Reports there is a nurse at her work who can prescribe them. Pt reports her sister just got a meter to start checking her own blood sugar.   Reports she tried the Special K Protein cereal and likes it dry, but can't do with milk. Reports almond milk is too sweet and A2 was too thick.  Reports sometimes having the dry cereal for breakfast and sometimes may have an apple for breakfast. Pt reports she is drinking at least 6 glasses of water daily now.   Pt reports she has been trying to go to the gym 4 days x 60 minutes (bike 30 minutes, strength machines, treadmill x 1 mile) per week. Started back this week.   Pt reports improvement in constipation.  Pertinent Lab Values:  09/20/19:  HgbA1c: 6.5  06/18/19:  LDL Cholesterol: 108 (H) HgbA1c: 6.5  Avoided Foods: No corn, peanut butter or peanuts, chocolate, banana (heart burn) dairy due to causing GI issues per pt. Peanuts cause rectal itching per pt. Reports almonds, cashews, pistachios are tolerated.   24 Hour Recall: Breakfast: dry Special K Protein cereal, water  Snk: None reported.  Lunch: Unsure if she had lunch.  Snk: None reported.  Dinner: hamburger patty, no bun, small amount of mashed potatoes, water   Beverages: typically at least 6 glasses of water.  Individualized Plan for Diabetes Self-Management Training:   Learning Objective:  Patient will have a greater understanding of diabetes  self-management. Patient education plan is to attend individual and/or group sessions per assessed needs and concerns.   Plan: Praised pt for getting back at the gym and increasing water intake. Discussed importance of eating regularly and discussed how adding protein with carbohydrates at meals/snacks helps to even out curve of blood sugar rise. Discussed balanced breakfast ideas from foods pt reports are well tolerated. Recommended pt take calcium supplement of 500 mg twice daily due to limited dairy intake with reported intolerance. Encouraged pt to check about glucose meter prescription and to call if any questions about SMBG. Pt appeared agreeable to information/goals discussed.   Instructions/Goals:  -Eat every 3-5 hours/3 meals and may have a snack in between if meals are spaced more than 5 hours apart.   -If you don't feel like eating a meal, try to at least have a balanced snack to keep blood sugar maintained (see handout)  -Ideas for morning meal: Try to eat within 1 hour of waking  Apple or another fruit and almond butter   Special K Protein Cereals (at least 8 g protein per serving)    Oatmeal and almonds  Recommend 500 mg calcium 2 times per day at separate meals to ensure you received adequate calcium with limited dairy intake.   Make physical activity a part of your week. Try to include at least 30 minutes of physical activity 5 days each week or at least 150 minutes per week. Regular physical activity promotes overall  health-including helping to reduce risk for heart disease and diabetes, promoting mental health, and helping [place] sleep better.    Great job getting in more physical activity! Keep it up!   Patient Instructions  Instructions/Goals:  -Eat every 3-5 hours/3 meals and may have a snack in between if meals are spaced more than 5 hours apart.   -If you don't feel like eating a meal, try to at least have a balanced snack to keep blood sugar maintained (see handout)   -Ideas for morning meal: Try to eat within 1 hour of waking  Apple or another fruit and almond butter   Special K Protein Cereals (at least 8 g protein per serving)    Oatmeal and almonds  Recommend 500 mg calcium 2 times per day at separate meals to ensure you received adequate calcium with limited dairy intake.   Make physical activity a part of your week. Try to include at least 30 minutes of physical activity 5 days each week or at least 150 minutes per week. Regular physical activity promotes overall health-including helping to reduce risk for heart disease and diabetes, promoting mental health, and helping [place] sleep better.    Great job getting in more physical activity! Keep it up!      Education material provided: None.   If problems or questions, patient to contact team via:  Phone and Email  Future DSME appointment:   2 months.

## 2019-11-21 NOTE — Patient Instructions (Addendum)
Instructions/Goals:  -Eat every 3-5 hours/3 meals and may have a snack in between if meals are spaced more than 5 hours apart.   -If you don't feel like eating a meal, try to at least have a balanced snack to keep blood sugar maintained (see handout)  -Ideas for morning meal: Try to eat within 1 hour of waking  Apple or another fruit and almond butter   Special K Protein Cereals (at least 8 g protein per serving)    Oatmeal and almonds  Recommend 500 mg calcium 2 times per day at separate meals to ensure you received adequate calcium with limited dairy intake.   Make physical activity a part of your week. Try to include at least 30 minutes of physical activity 5 days each week or at least 150 minutes per week. Regular physical activity promotes overall health-including helping to reduce risk for heart disease and diabetes, promoting mental health, and helping [place] sleep better.    Great job getting in more physical activity! Keep it up!

## 2020-01-16 ENCOUNTER — Ambulatory Visit: Payer: 59 | Admitting: Registered"

## 2020-03-27 ENCOUNTER — Other Ambulatory Visit: Payer: Self-pay

## 2020-03-27 ENCOUNTER — Ambulatory Visit
Admission: EM | Admit: 2020-03-27 | Discharge: 2020-03-27 | Disposition: A | Discharge status: Home / Self Care | Payer: 59

## 2020-03-27 DIAGNOSIS — M546 Pain in thoracic spine: Secondary | ICD-10-CM | POA: Diagnosis not present

## 2020-03-27 DIAGNOSIS — M545 Low back pain, unspecified: Secondary | ICD-10-CM

## 2020-03-27 DIAGNOSIS — M542 Cervicalgia: Secondary | ICD-10-CM

## 2020-03-27 HISTORY — DX: Type 2 diabetes mellitus without complications: E11.9

## 2020-03-27 MED ORDER — TIZANIDINE HCL 2 MG PO TABS: 2.0000 mg | ORAL_TABLET | Freq: Three times a day (TID) | ORAL | 0 refills | Status: DC | PRN

## 2020-03-27 MED ORDER — MELOXICAM 7.5 MG PO TABS: 7.5000 mg | ORAL_TABLET | Freq: Every day | ORAL | 0 refills | Status: DC
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
## 2020-05-26 ENCOUNTER — Other Ambulatory Visit: Payer: Self-pay

## 2020-05-26 ENCOUNTER — Ambulatory Visit
Admission: EM | Admit: 2020-05-26 | Discharge: 2020-05-26 | Disposition: A | Discharge status: Home / Self Care | Payer: 59 | Attending: Emergency Medicine | Admitting: Emergency Medicine

## 2020-05-26 DIAGNOSIS — S61211A Laceration without foreign body of left index finger without damage to nail, initial encounter: Secondary | ICD-10-CM | POA: Diagnosis not present

## 2020-05-26 NOTE — ED Triage Notes (Signed)
Pt states at work, cut her lt pointer finger on a piece of metal on a light. Small lac noted, bleeding controlled.

## 2020-05-26 NOTE — ED Provider Notes (Signed)
[facility]    CSN: [id number] Arrival date & time: 05/26/20  1201      History   Chief Complaint Chief Complaint  Patient presents with  . Laceration    HPI April Daniel is a 54 y.o. female   Presenting for left index finger laceration that occurred shortly PTA.  States she was at work and cut her finger on a piece of metal that was on a light.  Denies anticoagulant/blood thinner use, easy bruising or bleeding.  Has applied direct pressure.  Past Medical History:  Diagnosis Date  . Diabetes mellitus without complication (HCC)   . Hyperlipidemia   . Hypertension     There are no problems to display for this patient.   Past Surgical History:  Procedure Laterality Date  . ORIF CLAVICULAR FRACTURE Right 04/23/2018   Procedure: OPEN TREATMENT OF DISTAL CLAVICLE FRACTURE;  Surgeon: Mack Hook, MD;  Location: [facility];  Service: Orthopedics;  Laterality: Right;  ANESTHESIA: PRE-OP BLOCK  . TUBAL LIGATION      OB History   No obstetric history on file.      Home Medications    Prior to Admission medications   Medication Sig Start Date End Date Taking? Authorizing Provider  simvastatin (ZOCOR) 40 MG tablet Take 40 mg by mouth daily.  03/27/20  [provider]  acetaminophen (TYLENOL) 325 MG tablet Take 2 tablets (650 mg total) by mouth every 6 (six) hours. 04/23/18   Mack Hook, MD  atorvastatin (LIPITOR) 40 MG tablet Take 40 mg by mouth daily.    [provider]  Coenzyme Q10 (COQ10 PO) Take by mouth.    [provider]  GARLIC PO Take by mouth.    [provider]  lisinopril (PRINIVIL,ZESTRIL) 40 MG tablet Take 40 mg by mouth daily.    [provider]  metFORMIN (GLUCOPHAGE) 500 MG tablet metformin 500 mg tablet    [provider]  Multiple Vitamin (MULTIVITAMIN) capsule Take 1 capsule by mouth daily.    [provider]  tiZANidine (ZANAFLEX) 2 MG tablet Take 1  tablet (2 mg total) by mouth every 8 (eight) hours as needed for muscle spasms. 03/27/20   Belinda Fisher, PA-C  Turmeric (QC TUMERIC COMPLEX PO) Take by mouth.    [provider]    Family History Family History  Problem Relation Age of Onset  . Hypertension Mother   . Diabetes Mother   . Hypertension Sister   . Hyperlipidemia Sister   . Diabetes Sister   . Breast cancer Neg Hx     Social History Social History   Tobacco Use  . Smoking status: Never Smoker  . Smokeless tobacco: Never Used  Vaping Use  . Vaping Use: Never used  Substance Use Topics  . Alcohol use: No  . Drug use: No     Allergies   Tramadol   Review of Systems Review of Systems  Musculoskeletal: Negative for joint swelling.  Skin: Positive for wound.  Neurological: Negative for weakness and numbness.  All other systems reviewed and are negative.    Physical Exam Triage Vital Signs ED Triage Vitals [05/26/20 1222]  Enc Vitals Group     BP 136/81     Pulse Rate 72     Resp 18     Temp      Temp Source Oral     SpO2 96 %     Weight      Height  Head Circumference      Peak Flow      Pain Score 0     Pain Loc      Pain Edu?      Excl. in GC?    No data found.  Updated Vital Signs BP 136/81 (BP Location: Left Arm)   Pulse 72   Resp 18   LMP 03/24/2013   SpO2 96%   Visual Acuity Right Eye Distance:   Left Eye Distance:   Bilateral Distance:    Right Eye Near:   Left Eye Near:    Bilateral Near:     Physical Exam Constitutional:      General: She is not in acute distress. HENT:     Head: Normocephalic and atraumatic.  Eyes:     General: No scleral icterus.    Pupils: Pupils are equal, round, and reactive to light.  Cardiovascular:     Rate and Rhythm: Normal rate.  Pulmonary:     Effort: Pulmonary effort is normal.  Musculoskeletal:        General: Tenderness present. No swelling. Normal range of motion.     Comments: L index finger w/ lac to DIP.  NVI.   Bleeding controlled, not contaminated.  Skin:    Capillary Refill: Capillary refill takes less than 2 seconds.     Coloration: Skin is not jaundiced or pale.  Neurological:     General: No focal deficit present.     Mental Status: She is alert and oriented to person, place, and time.      UC Treatments / Results  Labs (all labs ordered are listed, but only abnormal results are displayed) Labs Reviewed - No data to display  EKG   Radiology No results found.  Procedures Laceration Repair  Date/Time: 05/26/2020 2:05 PM Performed by: Shea Evans, PA-C Authorized by: Shea Evans, PA-C   Consent:    Consent obtained:  Verbal   Consent given by:  Patient   Risks discussed:  Infection, need for additional repair, pain, poor cosmetic result and poor wound healing   Alternatives discussed:  No treatment and delayed treatment Universal protocol:    Patient identity confirmed:  Verbally with patient Anesthesia (see MAR for exact dosages):    Anesthesia method:  Nerve block   Block location:  L 2nd MCP   Block needle gauge:  25 G   Block anesthetic:  Lidocaine 2% w/o epi   Block technique:  Digital   Block injection procedure:  Anatomic landmarks palpated, anatomic landmarks identified, introduced needle, negative aspiration for blood and incremental injection   Block outcome:  Anesthesia achieved Laceration details:    Location:  Finger   Finger location:  L index finger   Length (cm):  1.5   Depth (mm):  3 Repair type:    Repair type:  Simple Pre-procedure details:    Preparation:  Patient was prepped and draped in usual sterile fashion Exploration:    Hemostasis achieved with:  Direct pressure   Wound exploration: wound explored through full range of motion     Contaminated: no   Treatment:    Area cleansed with:  Hibiclens   Amount of cleaning:  Standard   Irrigation solution:  Sterile saline   Irrigation method:  Pressure wash Skin repair:     Repair method:  Sutures   Suture size:  5-0   Suture material:  Prolene   Suture technique:  Simple interrupted and horizontal mattress   Number of sutures:  3 Approximation:    Approximation:  Close Post-procedure details:    Dressing:  Adhesive bandage   Patient tolerance of procedure:  Tolerated well, no immediate complications Comments:     NVI pre/post proc   (including critical care time)  Medications Ordered in UC Medications - No data to display  Initial Impression / Assessment and Plan / UC Course  I have reviewed the triage vital signs and the nursing notes.  Pertinent labs & imaging results that were available during my care of the patient were reviewed by me and considered in my medical decision making (see chart for details).     3 sutures placed.  Tetanus UTD.  Return precautions discussed, pt verbalized understanding and is agreeable to plan. Final Clinical Impressions(s) / UC Diagnoses   Final diagnoses:  Laceration of left index finger without foreign body without damage to nail, initial encounter     Discharge Instructions     Keep area(s) clean and dry. Apply ice for 20 min 3-5 times daily. Return for worsening pain, redness, swelling, discharge, fever.    ED Prescriptions    None     PDMP not reviewed this encounter.   Hall-Potvin, [place], [place] 05/26/20 1407

## 2020-05-26 NOTE — Discharge Instructions (Signed)
Keep area(s) clean and dry. Apply ice for 20 min 3-5 times daily. Return for worsening pain, redness, swelling, discharge, fever. 

## 2020-06-05 ENCOUNTER — Other Ambulatory Visit: Payer: Self-pay

## 2020-06-05 ENCOUNTER — Ambulatory Visit
Admission: EM | Admit: 2020-06-05 | Discharge: 2020-06-05 | Disposition: A | Discharge status: Home / Self Care | Payer: 59

## 2020-06-05 NOTE — ED Triage Notes (Signed)
Pt had 3 sutures removed from left pointer finger. Pt tolerated procedure well. NVI.

## 2020-06-28 ENCOUNTER — Telehealth (HOSPITAL_COMMUNITY): Payer: Self-pay | Admitting: Family

## 2020-06-28 ENCOUNTER — Telehealth: Payer: Self-pay | Admitting: Physician Assistant

## 2020-06-28 DIAGNOSIS — R69 Illness, unspecified: Secondary | ICD-10-CM

## 2020-06-28 NOTE — Telephone Encounter (Signed)
Called to discuss with patient about Covid symptoms and the use of sotrovimab, bamlanivimab/etesevimab or casirivimab/imdevimab, a monoclonal antibody infusion for those with mild to moderate Covid symptoms and at a high risk of hospitalization.  Pt is qualified for this infusion at the [facility] due to; Specific high risk criteria : BMI > 25, Diabetes, Cardiovascular disease or hypertension and Other high risk medical condition per CDC:  high SVI   Message left to call back our hotline [phone]. My chart message sent if active on Mychart.   Cline Crock PA-C

## 2020-06-28 NOTE — Telephone Encounter (Addendum)
Called to discuss with April Daniel about Covid symptoms and potential candidacy for the use of sotrovimab, a combination monoclonal antibody infusion for those with mild to moderate Covid symptoms and at a high risk of hospitalization.     Pt is qualified for this infusion at the infusion center due to co-morbid conditions and/or a member of an at-risk group, and was referred to the [facility] clinic from a provider however patient states she would like to speak with her PCP and call us back. Explained briefly the purpose and time frame on the  infusion and then offered the phone number to the clinic, and the call was disconnected from her end.  Previous attempt noted from another staff member.   Fordyce Lepak,NP

## 2020-09-25 ENCOUNTER — Other Ambulatory Visit: Payer: Self-pay | Admitting: Physician Assistant

## 2020-09-25 DIAGNOSIS — Z1231 Encounter for screening mammogram for malignant neoplasm of breast: Secondary | ICD-10-CM

## 2020-11-17 ENCOUNTER — Ambulatory Visit: Payer: 59

## 2020-12-18 ENCOUNTER — Other Ambulatory Visit: Payer: Self-pay

## 2020-12-18 ENCOUNTER — Ambulatory Visit
Admission: RE | Admit: 2020-12-18 | Discharge: 2020-12-18 | Disposition: A | Discharge status: Home / Self Care | Payer: 59 | Source: Ambulatory Visit | Attending: Physician Assistant | Admitting: Physician Assistant

## 2020-12-18 DIAGNOSIS — Z1231 Encounter for screening mammogram for malignant neoplasm of breast: Secondary | ICD-10-CM

## 2022-01-03 ENCOUNTER — Other Ambulatory Visit: Payer: Self-pay | Admitting: Physician Assistant

## 2022-01-03 DIAGNOSIS — Z1231 Encounter for screening mammogram for malignant neoplasm of breast: Secondary | ICD-10-CM

## 2022-01-06 ENCOUNTER — Ambulatory Visit
Admission: RE | Admit: 2022-01-06 | Discharge: 2022-01-06 | Disposition: A | Discharge status: Home / Self Care | Payer: 59 | Source: Ambulatory Visit | Attending: Physician Assistant | Admitting: Physician Assistant

## 2022-01-06 DIAGNOSIS — Z1231 Encounter for screening mammogram for malignant neoplasm of breast: Secondary | ICD-10-CM

## 2022-12-08 ENCOUNTER — Other Ambulatory Visit: Payer: Self-pay | Admitting: Physician Assistant

## 2022-12-08 DIAGNOSIS — Z1231 Encounter for screening mammogram for malignant neoplasm of breast: Secondary | ICD-10-CM

## 2023-01-10 ENCOUNTER — Ambulatory Visit
Admission: RE | Admit: 2023-01-10 | Discharge: 2023-01-10 | Disposition: A | Discharge status: Home / Self Care | Payer: BC Managed Care – PPO | Source: Ambulatory Visit | Attending: Physician Assistant | Admitting: Physician Assistant

## 2023-01-10 DIAGNOSIS — Z1231 Encounter for screening mammogram for malignant neoplasm of breast: Secondary | ICD-10-CM

## 2023-08-05 ENCOUNTER — Encounter (HOSPITAL_COMMUNITY): Payer: Self-pay

## 2023-08-05 ENCOUNTER — Ambulatory Visit (HOSPITAL_COMMUNITY)
Admission: EM | Admit: 2023-08-05 | Discharge: 2023-08-05 | Disposition: A | Discharge status: Home / Self Care | Payer: Self-pay | Attending: Family Medicine | Admitting: Family Medicine

## 2023-08-05 DIAGNOSIS — S161XXA Strain of muscle, fascia and tendon at neck level, initial encounter: Secondary | ICD-10-CM

## 2023-08-05 DIAGNOSIS — M545 Low back pain, unspecified: Secondary | ICD-10-CM

## 2023-08-05 MED ORDER — CYCLOBENZAPRINE HCL 10 MG PO TABS: ORAL_TABLET | ORAL | 0 refills | Status: AC

## 2023-08-05 MED ORDER — DICLOFENAC SODIUM 75 MG PO TBEC: 75.0000 mg | DELAYED_RELEASE_TABLET | Freq: Two times a day (BID) | ORAL | 0 refills | Status: AC

## 2023-08-05 NOTE — Discharge Instructions (Signed)
 HOME CARE INSTRUCTIONS: For many people, back pain returns. Since low back pain is rarely dangerous, it is often a condition that people can learn to manage on their own. Please remain active. It is stressful on the back to sit or stand in one place. Do not sit, drive, or stand in one place for more than 30 minutes at a time. Take short walks on level surfaces as soon as pain allows. Try to increase the length of time you walk each day. Do not stay in bed. Resting more than 1 or 2 days can delay your recovery. Do not avoid exercise or work. Your body is made to move. It is not dangerous to be active, even though your back may hurt. Your back will likely heal faster if you return to being active before your pain is gone. Over-the-counter medicines to reduce pain and inflammation are often the most helpful.  SEEK MEDICAL CARE IF: You have pain that is not relieved with rest or medicine. You have pain that does not improve in 1 week. You have new symptoms. You are generally not feeling well.  SEEK IMMEDIATE MEDICAL CARE IF: You have pain that radiates from your back into your legs. You develop new bowel or bladder control problems. You have unusual weakness or numbness in your arms or legs. You develop nausea or vomiting. You develop abdominal pain. You feel faint.
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
## 2023-12-11 ENCOUNTER — Other Ambulatory Visit: Payer: Self-pay | Admitting: Physician Assistant

## 2023-12-11 DIAGNOSIS — Z1231 Encounter for screening mammogram for malignant neoplasm of breast: Secondary | ICD-10-CM

## 2024-01-11 ENCOUNTER — Ambulatory Visit
Admission: RE | Admit: 2024-01-11 | Discharge: 2024-01-11 | Disposition: A | Discharge status: Home / Self Care | Source: Ambulatory Visit | Attending: Physician Assistant | Admitting: Physician Assistant

## 2024-01-11 DIAGNOSIS — Z1231 Encounter for screening mammogram for malignant neoplasm of breast: Secondary | ICD-10-CM

## 2024-05-10 ENCOUNTER — Ambulatory Visit (INDEPENDENT_AMBULATORY_CARE_PROVIDER_SITE_OTHER)

## 2024-05-10 ENCOUNTER — Ambulatory Visit

## 2024-05-10 DIAGNOSIS — M722 Plantar fascial fibromatosis: Secondary | ICD-10-CM

## 2024-05-10 MED ORDER — TRIAMCINOLONE ACETONIDE 10 MG/ML IJ SUSP
5.0000 mg | Freq: Once | INTRAMUSCULAR | Status: AC
  Administered 2024-05-10: 5 mg via INTRAMUSCULAR

## 2024-05-10 NOTE — Patient Instructions (Signed)

## 2024-05-10 NOTE — Progress Notes (Signed)
 Subjective:  Patient ID: April Daniel, female    DOB: 10-12-1965,  MRN: [id number]  Chief Complaint  Patient presents with   Plantar Fasciitis    Rm22 Patient complains of right heel pain for 1 month/meloxicam  and otc pain creams with no releif.    58 y.o. female presents with the above complaint.  She states that her right heel has been painful for 1 month.  It is most severe first up in the morning or after sitting down after an extended period of time.  She has tried taking meloxicam  and use lidocaine  patches without relief.  Review of Systems: Negative except as noted in the HPI. Denies N/V/F/Ch.  Past Medical History:  Diagnosis Date   Diabetes mellitus without complication (HCC)    Hyperlipidemia    Hypertension     Current Outpatient Medications:    atorvastatin (LIPITOR) 40 MG tablet, Take 40 mg by mouth daily., Disp: , Rfl:    Coenzyme Q10 (COQ10 PO), Take by mouth., Disp: , Rfl:    cyclobenzaprine  (FLEXERIL ) 10 MG tablet, Take 1 tablet by mouth 3 times daily as needed for muscle spasm. Warning: May cause drowsiness., Disp: 21 tablet, Rfl: 0   diclofenac  (VOLTAREN ) 75 MG EC tablet, Take 1 tablet (75 mg total) by mouth 2 (two) times daily., Disp: 14 tablet, Rfl: 0   GARLIC PO, Take by mouth., Disp: , Rfl:    lisinopril (PRINIVIL,ZESTRIL) 40 MG tablet, Take 40 mg by mouth daily., Disp: , Rfl:    metFORMIN (GLUCOPHAGE) 500 MG tablet, metformin 500 mg tablet, Disp: , Rfl:    Multiple Vitamin (MULTIVITAMIN) capsule, Take 1 capsule by mouth daily., Disp: , Rfl:    Turmeric (QC TUMERIC COMPLEX PO), Take by mouth., Disp: , Rfl:   Social History   Tobacco Use  Smoking Status Never  Smokeless Tobacco Never    Allergies  Allergen Reactions   Tramadol Nausea And Vomiting   Objective:  There were no vitals filed for this visit. There is no height or weight on file to calculate BMI. Constitutional Well developed. Well nourished. Oriented to person, place, and time.   Vascular Dorsalis pedis pulses palpable bilaterally. Posterior tibial pulses palpable bilaterally. Capillary refill normal to all digits.  No cyanosis or clubbing noted. Pedal hair growth normal.  Neurologic Normal speech. Epicritic sensation to light touch grossly present bilaterally. Negative tinel sign at tarsal tunnel bilaterally.   Dermatologic Skin texture and turgor are within normal limits.  No open wounds. No skin lesions.  Musculoskeletal: Normal HF and 1st MTP ROM without pain or crepitus bilaterally. Rectus footshape. Tender to palpation at the calcaneal tuber right. Minimal pain with calcaneal squeeze right. Ankle ROM diminished range of motion right. Silfverskiold Test: positive right.   Radiographs: Taken and reviewed. No acute fractures or dislocations. No evidence of stress fracture.  Plantar heel spur absent. Small posterior heel spur present. Rectus foot shape. Joint space narrowing at 3rd TMT that is non-symptomatic. Joint spaces otherwise well maintained.  Assessment:   1. Plantar fasciitis of right foot    Plan:  Patient was evaluated and treated and all questions answered.  Plantar Fasciitis, right - XR reviewed as above.  - Educated on icing and stretching. Instructions given.  - Discussed supportive shoegear and use of OTC insoles. Discussed that if insoles are effective, patient would benefit from custom molded insoles to better match foot shape.  - Discussed treatment options to reduce inflammation in plantar fascia. Risks and benefits discussed. The patient  would like to proceed with localized steroid injection as described below..   Procedure: Injection Tendon/Ligament Location: Right plantar fascia at the glabrous junction; medial approach. Skin Prep: alcohol Injectate: 0.5 cc 0.5% marcaine  plain, 0.5 cc of 1% Lidocaine , 0.5 cc kenalog 10. Disposition: Patient tolerated procedure well. Injection site dressed with a band-aid.  RTC 3  weeks  Prentice Ovens, DPM AACFAS Fellowship Trained Podiatric Surgeon [facility]

## 2024-06-14 ENCOUNTER — Ambulatory Visit
